# Patient Record
Sex: Male | Born: 1945 | Race: White | Hispanic: No | Marital: Married | Smoking: Current every day smoker
Health system: Southern US, Community
[De-identification: ages and names within clinical notes are randomized; demographics above are authoritative.]

## PROBLEM LIST (undated history)

## (undated) DIAGNOSIS — I639 Cerebral infarction, unspecified: Secondary | ICD-10-CM

## (undated) DIAGNOSIS — E785 Hyperlipidemia, unspecified: Secondary | ICD-10-CM

## (undated) DIAGNOSIS — M069 Rheumatoid arthritis, unspecified: Secondary | ICD-10-CM

## (undated) DIAGNOSIS — F0390 Unspecified dementia without behavioral disturbance: Secondary | ICD-10-CM

## (undated) DIAGNOSIS — I10 Essential (primary) hypertension: Secondary | ICD-10-CM

## (undated) HISTORY — DX: Cerebral infarction, unspecified: I63.9

## (undated) HISTORY — DX: Hyperlipidemia, unspecified: E78.5

## (undated) HISTORY — DX: Essential (primary) hypertension: I10

## (undated) HISTORY — DX: Unspecified dementia, unspecified severity, without behavioral disturbance, psychotic disturbance, mood disturbance, and anxiety: F03.90

## (undated) HISTORY — DX: Rheumatoid arthritis, unspecified: M06.9

---

## 2013-08-15 ENCOUNTER — Encounter: Payer: Self-pay | Admitting: Family Medicine

## 2013-08-15 ENCOUNTER — Ambulatory Visit (INDEPENDENT_AMBULATORY_CARE_PROVIDER_SITE_OTHER): Payer: Medicare Other | Admitting: Family Medicine

## 2013-08-15 ENCOUNTER — Other Ambulatory Visit: Payer: Self-pay | Admitting: Family Medicine

## 2013-08-15 VITALS — BP 132/76 | HR 86 | Temp 97.5°F | Resp 16 | Ht 70.5 in | Wt 180.0 lb

## 2013-08-15 DIAGNOSIS — R55 Syncope and collapse: Secondary | ICD-10-CM

## 2013-08-15 DIAGNOSIS — R42 Dizziness and giddiness: Secondary | ICD-10-CM

## 2013-08-15 DIAGNOSIS — R0989 Other specified symptoms and signs involving the circulatory and respiratory systems: Secondary | ICD-10-CM

## 2013-08-15 LAB — COMPREHENSIVE METABOLIC PANEL
ALBUMIN: 4.1 g/dL (ref 3.5–5.2)
ALT: 27 U/L (ref 0–53)
AST: 24 U/L (ref 0–37)
Alkaline Phosphatase: 100 U/L (ref 39–117)
BUN: 12 mg/dL (ref 6–23)
CALCIUM: 9 mg/dL (ref 8.4–10.5)
CHLORIDE: 98 meq/L (ref 96–112)
CO2: 23 mEq/L (ref 19–32)
CREATININE: 0.95 mg/dL (ref 0.50–1.35)
GLUCOSE: 111 mg/dL — AB (ref 70–99)
Potassium: 3.9 mEq/L (ref 3.5–5.3)
Sodium: 133 mEq/L — ABNORMAL LOW (ref 135–145)
Total Bilirubin: 0.5 mg/dL (ref 0.2–1.2)
Total Protein: 7.5 g/dL (ref 6.0–8.3)

## 2013-08-15 LAB — CBC WITH DIFFERENTIAL/PLATELET
BASOS PCT: 1 % (ref 0–1)
Basophils Absolute: 0.1 10*3/uL (ref 0.0–0.1)
EOS PCT: 2 % (ref 0–5)
Eosinophils Absolute: 0.1 10*3/uL (ref 0.0–0.7)
HEMATOCRIT: 48.2 % (ref 39.0–52.0)
HEMOGLOBIN: 16.4 g/dL (ref 13.0–17.0)
Lymphocytes Relative: 19 % (ref 12–46)
Lymphs Abs: 1.3 10*3/uL (ref 0.7–4.0)
MCH: 29.7 pg (ref 26.0–34.0)
MCHC: 34 g/dL (ref 30.0–36.0)
MCV: 87.2 fL (ref 78.0–100.0)
MONO ABS: 0.6 10*3/uL (ref 0.1–1.0)
MONOS PCT: 9 % (ref 3–12)
NEUTROS ABS: 4.9 10*3/uL (ref 1.7–7.7)
Neutrophils Relative %: 69 % (ref 43–77)
Platelets: 171 10*3/uL (ref 150–400)
RBC: 5.53 MIL/uL (ref 4.22–5.81)
RDW: 15.7 % — ABNORMAL HIGH (ref 11.5–15.5)
WBC: 7.1 10*3/uL (ref 4.0–10.5)

## 2013-08-15 LAB — LIPID PANEL
Cholesterol: 131 mg/dL (ref 0–200)
HDL: 28 mg/dL — AB (ref >39)
LDL Cholesterol: 68 mg/dL (ref 0–99)
TRIGLYCERIDES: 176 mg/dL — AB (ref <150)
Total CHOL/HDL Ratio: 4.7 Ratio
VLDL: 35 mg/dL (ref 0–40)

## 2013-08-15 LAB — TSH: TSH: 5.692 u[IU]/mL — ABNORMAL HIGH (ref 0.350–4.500)

## 2013-08-15 NOTE — Progress Notes (Signed)
Patient ID: Terry Francis, male   DOB: June 14, 1945, 68 y.o.   MRN: [id number]   Subjective:    Patient ID: Terry Francis, male    DOB: 1945-03-05, 68 y.o.   MRN: [id number]  Patient presents for Passing out  patient here secondary to syncopal event on this past Monday. He states he's been having increasing get dizzy episodes on and off for the past month or so he's also been very fatigued. He was going to a gas station bathroom when he just collapsed he's not sure how long he was in there but he was pumping gas it was still going by the time he went back outside so he does not think it was very long. He was assisted by anyone. He does not have any particular injuries and did not go to the hospital at that time. He's not had any chest pain or shortness of breath. He does have a history of myocardial infarction about 15 years ago. He's not take any medications with the exception of methotrexate for rheumatoid arthritis which is given by Dr. Dareen Piano that he does not take this on a regular basis. He states that the dizzy spells which has come on him know where it he's never had anything like this in the past    Review Of Systems:  GEN- + fatigue, fever, weight loss,weakness, recent illness HEENT- denies eye drainage, change in vision, nasal discharge, CVS- denies chest pain, palpitations RESP- denies SOB, cough, wheeze ABD- denies N/V, change in stools, abd pain GU- denies dysuria, hematuria, dribbling, incontinence MSK- denies joint pain, muscle aches, injury Neuro- +headache, +dizziness, syncope, seizure activity       Objective:    BP 132/76  Pulse 86  Temp(Src) 97.5 F (36.4 C) (Oral)  Resp 16  Ht 5' 10.5" (1.791 m)  Wt 180 lb (81.647 kg)  BMI 25.45 kg/m2 GEN- NAD, alert and oriented x3 HEENT- PERRL, EOMI, non injected sclera, pink conjunctiva, MMM, oropharynx clear Neck- Supple, no thyromegaly CVS- RRR, no murmur RESP-CTAB ABD-NABS,soft,NT,ND EXT- No edema Pulses- Radial,  DP- 2+ Neuro- CNII- XII in tact  EKG- NSR, LAD  Orthostatics- Lying 140/80  siting 132/76, standing 132/78       Assessment & Plan:      Problem List Items Addressed This Visit   Syncope and collapse - Primary     MRI of brain, carotid dopplers based on age  Check metabolic panel, TSH, look for other risk factors No Hypotension, EKG normal Possible dehydration during episode but does not explain the other dizzy spells, ( ? BPV)    Relevant Orders      CBC with Differential (Completed)      Comprehensive metabolic panel (Completed)      Lipid panel (Completed)      TSH (Completed)      PSA (Completed)      CT Head Wo Contrast      US Carotid Duplex Bilateral   Dizziness and giddiness     Per above work up, rule out neurological events and carotid stenosis first,    Relevant Orders      CT Head Wo Contrast      US Carotid Duplex Bilateral   Carotid bruit      Note: This dictation was prepared with Dragon dictation along with smaller phrase technology. Any transcriptional errors that result from this process are unintentional.

## 2013-08-15 NOTE — Patient Instructions (Addendum)
We will call with appt for CT of head  Ultrasound of neck We will call with lab results F/U pending results

## 2013-08-16 LAB — PSA: PSA: 0.86 ng/mL (ref <=4.00)

## 2013-08-17 ENCOUNTER — Telehealth: Payer: Self-pay | Admitting: *Deleted

## 2013-08-17 ENCOUNTER — Other Ambulatory Visit: Payer: Self-pay | Admitting: Family Medicine

## 2013-08-17 DIAGNOSIS — R42 Dizziness and giddiness: Secondary | ICD-10-CM

## 2013-08-17 DIAGNOSIS — R55 Syncope and collapse: Secondary | ICD-10-CM

## 2013-08-17 LAB — T3, FREE: T3, Free: 2.8 pg/mL (ref 2.3–4.2)

## 2013-08-17 LAB — T4, FREE: Free T4: 1.02 ng/dL (ref 0.80–1.80)

## 2013-08-17 NOTE — Telephone Encounter (Signed)
Received a call from Alvino Chapel at [facility] stating that pt authorization number showed MRI and on order was CT and needed to be changed if needed to be MRI, I told her that her insurance stated that it was CT authorization we both looked up the number and was MRI, I called insurance company to see if can change it to CT and they we can but his with his symptoms he was having he was approved for MRI instead of CT. I went to ask ordering provider if ok to order MRI since was approved and she stated it is ok. I ordered MRI and received authorization on it and called [facility] and pt is now scheduled for Sunday at 930am, pt is aware of change in appointment.

## 2013-08-18 DIAGNOSIS — R0989 Other specified symptoms and signs involving the circulatory and respiratory systems: Secondary | ICD-10-CM | POA: Insufficient documentation

## 2013-08-18 DIAGNOSIS — R42 Dizziness and giddiness: Secondary | ICD-10-CM | POA: Insufficient documentation

## 2013-08-18 NOTE — Assessment & Plan Note (Signed)
MRI of brain, carotid dopplers based on age  Check metabolic panel, TSH, look for other risk factors No Hypotension, EKG normal Possible dehydration during episode but does not explain the other dizzy spells, ( ? BPV)

## 2013-08-18 NOTE — Assessment & Plan Note (Signed)
Per above work up, rule out neurological events and carotid stenosis first,

## 2013-08-19 ENCOUNTER — Ambulatory Visit
Admission: RE | Admit: 2013-08-19 | Discharge: 2013-08-19 | Disposition: A | Discharge status: Home / Self Care | Payer: Self-pay | Source: Ambulatory Visit | Attending: Family Medicine | Admitting: Family Medicine

## 2013-08-19 DIAGNOSIS — R42 Dizziness and giddiness: Secondary | ICD-10-CM

## 2013-08-19 DIAGNOSIS — R55 Syncope and collapse: Secondary | ICD-10-CM

## 2013-08-20 ENCOUNTER — Other Ambulatory Visit: Payer: Self-pay

## 2013-08-20 ENCOUNTER — Ambulatory Visit
Admission: RE | Admit: 2013-08-20 | Discharge: 2013-08-20 | Disposition: A | Discharge status: Home / Self Care | Payer: Medicare Other | Source: Ambulatory Visit | Attending: Family Medicine | Admitting: Family Medicine

## 2013-08-20 DIAGNOSIS — R42 Dizziness and giddiness: Secondary | ICD-10-CM

## 2013-08-20 DIAGNOSIS — R55 Syncope and collapse: Secondary | ICD-10-CM

## 2013-08-22 ENCOUNTER — Ambulatory Visit (INDEPENDENT_AMBULATORY_CARE_PROVIDER_SITE_OTHER): Payer: Medicare Other | Admitting: Family Medicine

## 2013-08-22 ENCOUNTER — Encounter: Payer: Self-pay | Admitting: Family Medicine

## 2013-08-22 VITALS — BP 122/68 | HR 72 | Resp 16 | Ht 70.5 in | Wt 180.0 lb

## 2013-08-22 DIAGNOSIS — R42 Dizziness and giddiness: Secondary | ICD-10-CM

## 2013-08-22 DIAGNOSIS — R5381 Other malaise: Secondary | ICD-10-CM

## 2013-08-22 DIAGNOSIS — R5383 Other fatigue: Secondary | ICD-10-CM

## 2013-08-22 DIAGNOSIS — R55 Syncope and collapse: Secondary | ICD-10-CM

## 2013-08-22 DIAGNOSIS — R7309 Other abnormal glucose: Secondary | ICD-10-CM

## 2013-08-22 DIAGNOSIS — R7989 Other specified abnormal findings of blood chemistry: Secondary | ICD-10-CM

## 2013-08-22 NOTE — Assessment & Plan Note (Signed)
Repeat TSH in about 6 week

## 2013-08-22 NOTE — Assessment & Plan Note (Signed)
He continues to have dizzy spells however his workup has not revealed anything significant at this time that would be the cause. His blood pressure looks okay. I will send him to cardiology with his cardiac history to have him put a monitor on him to see we are missing any arrhythmias

## 2013-08-22 NOTE — Patient Instructions (Signed)
Referral to cardiology Testosterone level to be done, and repeat Sugar level F/U pending results

## 2013-08-22 NOTE — Assessment & Plan Note (Signed)
Per above regarding cardiology this may just be multifactorial with his age. I doubt the subclinical hypothyroidism is the cause therefore would not start any medication at this time. I will also check his testosterone level, and get an A1c as his glucose that he was not fasting was elevated some

## 2013-08-22 NOTE — Progress Notes (Signed)
Patient ID: Terry Francis, male   DOB: October 24, 1945, 68 y.o.   MRN: [id number]   Subjective:    Patient ID: Terry Francis, male    DOB: 08/12/1945, 68 y.o.   MRN: [id number]  Patient presents for Discuss Lab Results  patient here to followup recent dilation for syncope fatigue and dizziness. He's had another episode of dizziness that this came on out of nowhere. He continues to complain of been severely fatigued and having no energy. He denies any chest pain or palpitations but his wife is here today he states that he when he had his heart attack he did not having severe chest pain. His MRI showed chronic microvascular ischemic areas but nothing acute thought this was consistent with old strokes. His carotid Dopplers showed minimal stenosis less than 50% bilaterally his blood work showed minimally elevated glucose however he was nonfasting he also had a mildly elevated TSH but his T3 and T4 were normal making this consistent with a possible subclinical hypothyroidism. He states that he does take B12 and does not notice any difference with the I did discuss with his wife she has not noticed any change in his speech or his behavior   Review Of Systems:  GEN- denies fatigue, fever, weight loss,weakness, recent illness HEENT- denies eye drainage, change in vision, nasal discharge, CVS- denies chest pain, palpitations RESP- denies SOB, cough, wheeze MSK- denies joint pain, muscle aches, injury Neuro- denies headache, +dizziness, syncope, seizure activity       Objective:    BP 122/68  Pulse 72  Resp 16  Ht 5' 10.5" (1.791 m)  Wt 180 lb (81.647 kg)  BMI 25.45 kg/m2 GEN- NAD, alert and oriented x3 CVS- RRR, no murmur RESP-CTAB EXT- No edema Pulses- Radial 2+        Assessment & Plan:      Problem List Items Addressed This Visit   None    Visit Diagnoses   Other malaise and fatigue    -  Primary    Relevant Orders       Testosterone    Elevated glucose        Relevant Orders        Hemoglobin A1c       Note: This dictation was prepared with Dragon dictation along with smaller phrase technology. Any transcriptional errors that result from this process are unintentional.

## 2013-08-23 LAB — HEMOGLOBIN A1C
HEMOGLOBIN A1C: 6.2 % — AB (ref <5.7)
Mean Plasma Glucose: 131 mg/dL — ABNORMAL HIGH (ref <117)

## 2013-08-23 LAB — TESTOSTERONE: Testosterone: 252 ng/dL — ABNORMAL LOW (ref 300–890)

## 2013-09-03 ENCOUNTER — Encounter: Payer: Self-pay | Admitting: Family Medicine

## 2013-09-05 ENCOUNTER — Ambulatory Visit: Payer: Self-pay | Admitting: Family Medicine

## 2013-09-19 ENCOUNTER — Telehealth: Payer: Self-pay | Admitting: Family Medicine

## 2013-09-19 NOTE — Telephone Encounter (Signed)
Called and LM with wife for pt to call back and schedule [facility] and CPE

## 2013-09-25 ENCOUNTER — Encounter: Payer: Self-pay | Admitting: Family Medicine

## 2013-10-03 ENCOUNTER — Other Ambulatory Visit: Payer: Medicare Other

## 2013-10-10 ENCOUNTER — Ambulatory Visit (INDEPENDENT_AMBULATORY_CARE_PROVIDER_SITE_OTHER): Payer: Medicare Other | Admitting: Family Medicine

## 2013-10-10 ENCOUNTER — Encounter: Payer: Self-pay | Admitting: Family Medicine

## 2013-10-10 VITALS — BP 136/62 | HR 72 | Temp 97.5°F | Resp 14 | Ht 70.0 in | Wt 182.0 lb

## 2013-10-10 DIAGNOSIS — R42 Dizziness and giddiness: Secondary | ICD-10-CM

## 2013-10-10 DIAGNOSIS — E039 Hypothyroidism, unspecified: Secondary | ICD-10-CM

## 2013-10-10 DIAGNOSIS — Z23 Encounter for immunization: Secondary | ICD-10-CM

## 2013-10-10 DIAGNOSIS — Z0000 Encounter for general adult medical examination without abnormal findings: Secondary | ICD-10-CM

## 2013-10-10 DIAGNOSIS — E038 Other specified hypothyroidism: Secondary | ICD-10-CM

## 2013-10-10 DIAGNOSIS — E349 Endocrine disorder, unspecified: Secondary | ICD-10-CM

## 2013-10-10 DIAGNOSIS — E291 Testicular hypofunction: Secondary | ICD-10-CM

## 2013-10-10 LAB — CBC WITH DIFFERENTIAL/PLATELET
BASOS ABS: 0.1 10*3/uL (ref 0.0–0.1)
BASOS PCT: 1 % (ref 0–1)
Eosinophils Absolute: 0.1 10*3/uL (ref 0.0–0.7)
Eosinophils Relative: 2 % (ref 0–5)
HCT: 47.4 % (ref 39.0–52.0)
HEMOGLOBIN: 16.3 g/dL (ref 13.0–17.0)
LYMPHS PCT: 18 % (ref 12–46)
Lymphs Abs: 1.1 10*3/uL (ref 0.7–4.0)
MCH: 29.7 pg (ref 26.0–34.0)
MCHC: 34.4 g/dL (ref 30.0–36.0)
MCV: 86.5 fL (ref 78.0–100.0)
MONOS PCT: 9 % (ref 3–12)
Monocytes Absolute: 0.6 10*3/uL (ref 0.1–1.0)
NEUTROS ABS: 4.3 10*3/uL (ref 1.7–7.7)
NEUTROS PCT: 70 % (ref 43–77)
Platelets: 150 10*3/uL (ref 150–400)
RBC: 5.48 MIL/uL (ref 4.22–5.81)
RDW: 16.2 % — ABNORMAL HIGH (ref 11.5–15.5)
WBC: 6.2 10*3/uL (ref 4.0–10.5)

## 2013-10-10 LAB — COMPREHENSIVE METABOLIC PANEL
ALBUMIN: 4 g/dL (ref 3.5–5.2)
ALT: 26 U/L (ref 0–53)
AST: 23 U/L (ref 0–37)
Alkaline Phosphatase: 103 U/L (ref 39–117)
BUN: 11 mg/dL (ref 6–23)
CALCIUM: 9.1 mg/dL (ref 8.4–10.5)
CO2: 22 mEq/L (ref 19–32)
Chloride: 99 mEq/L (ref 96–112)
Creat: 0.89 mg/dL (ref 0.50–1.35)
GLUCOSE: 155 mg/dL — AB (ref 70–99)
POTASSIUM: 4.2 meq/L (ref 3.5–5.3)
Sodium: 132 mEq/L — ABNORMAL LOW (ref 135–145)
Total Bilirubin: 0.5 mg/dL (ref 0.2–1.2)
Total Protein: 7.1 g/dL (ref 6.0–8.3)

## 2013-10-10 LAB — VITAMIN B12: VITAMIN B 12: 875 pg/mL (ref 211–911)

## 2013-10-10 MED ORDER — LEVOTHYROXINE SODIUM 25 MCG PO TABS: 25.0000 ug | ORAL_TABLET | Freq: Every day | ORAL | Status: DC

## 2013-10-10 MED ORDER — MECLIZINE HCL 25 MG PO TABS: 25.0000 mg | ORAL_TABLET | Freq: Three times a day (TID) | ORAL | Status: DC | PRN

## 2013-10-10 NOTE — Patient Instructions (Addendum)
Pneumonia vaccine given  We will call with lab work Start the thyroid - take first thing in the morning  F/U 3 months

## 2013-10-11 DIAGNOSIS — E038 Other specified hypothyroidism: Secondary | ICD-10-CM | POA: Insufficient documentation

## 2013-10-11 DIAGNOSIS — E349 Endocrine disorder, unspecified: Secondary | ICD-10-CM | POA: Insufficient documentation

## 2013-10-11 DIAGNOSIS — E039 Hypothyroidism, unspecified: Secondary | ICD-10-CM | POA: Insufficient documentation

## 2013-10-11 LAB — FSH/LH
FSH: 3.6 m[IU]/mL (ref 1.4–18.1)
LH: 7 m[IU]/mL (ref 1.5–9.3)

## 2013-10-11 NOTE — Progress Notes (Signed)
Patient ID: Terry Francis, male   DOB: 11-Sep-1945, 68 y.o.   MRN: [id number] Subjective:   Patient presents for Medicare Annual/Subsequent preventive examination.   Review Past Medical/Family/Social: per EMR   Pt here for CPE, he was upset he was here and wanted to know why since he just had a visit. He declines immunizations, colonoscopy. Declines hearing assesment or hearing aide. He was recently evaluated for dizziness and syncope, recommended cardiac work up he declines, continues to have dizzy spells and low energy, labs did show subclinical hypothyrodism  Risk Factors  Current exercise habits: walks Dietary issues discussed: yes  Cardiac risk factors: none  Depression Screen  (Note: if answer to either of the following is "Yes", a more complete depression screening is indicated)  Over the past two weeks, have you felt down, depressed or hopeless? No Over the past two weeks, have you felt little interest or pleasure in doing things? No Have you lost interest or pleasure in daily life? No Do you often feel hopeless? No Do you cry easily over simple problems? No   Activities of Daily Living  In your present state of health, do you have any difficulty performing the following activities?:  Driving? No  Managing money? No  Feeding yourself? No  Getting from bed to chair? No  Climbing a flight of stairs? No  Preparing food and eating?: No  Bathing or showering? No  Getting dressed: No  Getting to the toilet? No  Using the toilet:No  Moving around from place to place: No  In the past year have you fallen or had a near fall?:yes  Are you sexually active? No  Do you have more than one partner? No   Hearing Difficulties: No  Do you often ask people to speak up or repeat themselves? yes Do you experience ringing or noises in your ears? No Do you have difficulty understanding soft or whispered voices? yes Do you feel that you have a problem with memory? yes Do you often  misplace items? No  Do you feel safe at home? Yes  Cognitive Testing  Alert? Yes Normal Appearance?Yes  Oriented to person? Yes Place? Yes  Time? Yes  Recall of three objects? Yes  Can perform simple calculations? Yes  Displays appropriate judgment?Yes  Can read the correct time from a watch face?Yes   List the Names of Other Physician/Practitioners you currently use: [facility]     Screening Tests / Date Colonoscopy  - states he had in past                  Zostavax - declines Influenza Vaccine  Tetanus/tdap- unkown  GEN- + fatigue, fever, weight loss,weakness, recent illness HEENT- denies eye drainage, change in vision, nasal discharge, CVS- denies chest pain, palpitations RESP- denies SOB, cough, wheeze ABD- denies N/V, change in stools, abd pain GU- denies dysuria, hematuria, dribbling, incontinence MSK- denies joint pain, muscle aches, injury Neuro- denies headache, dizziness, syncope, seizure activity  GEN- NAD, alert and oriented x3 HEENT- PERRL, EOMI, non injected sclera, pink conjunctiva, MMM, oropharynx clear Neck- Supple, no LAD CVS- RRR, no murmur RESP-CTAB ABD-NABS,soft,NT,ND EXT- No edema Pulses- Radial, DP- 2+   Assessment:    Annual wellness medicare exam   Plan:    During the course of the visit the patient was educated and counseled about appropriate screening and preventive services including:   Colorectal cancer screening - declines  Declines shots Declines hearing assessment   Prevnar 13 given  Diet  review for nutrition referral? Yes ____ Not Indicated __x__  Patient Instructions (the written plan) was given to the patient.  Medicare Attestation  I have personally reviewed:  The patient's medical and social history  Their use of alcohol, tobacco or illicit drugs  Their current medications and supplements  The patient's functional ability including ADLs,fall risks, home safety risks, cognitive, and hearing and visual impairment   Diet and physical activities  Evidence for depression or mood disorders  The patient's weight, height, BMI, and visual acuity have been recorded in the chart. I have made referrals, counseling, and provided education to the patient based on review of the above and I have provided the patient with a written personalized care plan for preventive services.

## 2013-10-11 NOTE — Assessment & Plan Note (Signed)
Low testosterone but I do not hink this is cause of his previous symptoms , syncope, will not treat at this time, start with thyroid replacement

## 2013-10-11 NOTE — Assessment & Plan Note (Signed)
Possible cause of his symptoms, treat with synthroid once a day

## 2013-12-01 ENCOUNTER — Other Ambulatory Visit: Payer: Self-pay | Admitting: Family Medicine

## 2013-12-04 NOTE — Telephone Encounter (Signed)
OK refill?? 

## 2014-06-11 ENCOUNTER — Encounter: Payer: Self-pay | Admitting: Family Medicine

## 2014-06-11 ENCOUNTER — Ambulatory Visit (INDEPENDENT_AMBULATORY_CARE_PROVIDER_SITE_OTHER): Payer: Medicare HMO | Admitting: Family Medicine

## 2014-06-11 VITALS — BP 108/60 | HR 80 | Temp 97.8°F | Resp 18 | Wt 180.0 lb

## 2014-06-11 DIAGNOSIS — R42 Dizziness and giddiness: Secondary | ICD-10-CM | POA: Diagnosis not present

## 2014-06-11 DIAGNOSIS — F0391 Unspecified dementia with behavioral disturbance: Secondary | ICD-10-CM

## 2014-06-11 DIAGNOSIS — I679 Cerebrovascular disease, unspecified: Secondary | ICD-10-CM

## 2014-06-11 DIAGNOSIS — F03918 Unspecified dementia, unspecified severity, with other behavioral disturbance: Secondary | ICD-10-CM

## 2014-06-11 MED ORDER — DONEPEZIL HCL 10 MG PO TABS: 10.0000 mg | ORAL_TABLET | Freq: Every day | ORAL | Status: AC

## 2014-06-11 NOTE — Progress Notes (Signed)
Subjective:    Patient ID: Terry Francis, male    DOB: 04-18-1945, 69 y.o.   MRN: [id number]  HPI  Patient is here today accompanied by his wife.  He is complaining of dizziness. Every morning when he wakes up he staggers getting out of the bed due to a sense of imbalance.  He also has some mild vertigo. He was treated with meclizine without relief last year. 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  Patient is not taking aspirin. He continues to smoke. He is on no statin medication. However his wife is more concerned by his confusion. She states that every few days he will become very confused and disoriented. For example he will walk around the house neck and for no reason. When she tells him to go put on his housecoat, the patient will return wearing a dress shirt. He is unsure of the date and the time in the day. She frequently has to remind him of things. So has to repeat what she told him because he forgets what she told him. At times he is delirious. The symptoms gradually improved over a few days and return to the patient's baseline. Today the patient is unable to tell me what month it is, what year it is. He does know the day.  He is able to tell me the location. He is able to remember 3 objects on recall only after 4 repetitions.  Patient is illiterate and therefore cannot perform WORLD test.  However on serial sevens he is unable to calculate 1 number. This is a patient who worked years in [employer] and was constantly performing math addition and subtraction in his head. The remainder of his Mini-Mental status exam was normal except when asked the patient to draw a clock, he was able to space the numbers correctly however he was unable to draw the hands on the clock to make it say  4:30. However he is able to read the clock today and tell me what time it is.  Past Medical History  Diagnosis Date  .  RA (rheumatoid arthritis)    No past surgical history on file. Current Outpatient Prescriptions on File Prior to Visit  Medication Sig Dispense Refill  . HYDROcodone-acetaminophen (NORCO) 7.5-325 MG per tablet Take 1 tablet by mouth every 6 (six) hours as needed for moderate pain.    Marland Kitchen levothyroxine (SYNTHROID) 25 MCG tablet Take 1 tablet (25 mcg total) by mouth daily before breakfast. 30 tablet 3  . meclizine (ANTIVERT) 25 MG tablet TAKE 1 TABLET BY MOUTH 3 TIMES DAILY AS NEEDED FOR DIZZINESS. (Patient not taking: Reported on 06/11/2014) 30 tablet 1  . methotrexate (RHEUMATREX) 7.5 MG tablet Take 7.5 mg by mouth once a week. Caution" Chemotherapy. Protect from light.     No current facility-administered medications on file prior to visit.   No Known Allergies History   Social History  . Marital Status: Married    Spouse Name: N/A  . Number of Children: N/A  . Years of Education: N/A   Occupational History  . Not on file.   Social History Main Topics  . Smoking status: Current Every Day Smoker -- 1.00 packs/day for 50 years    Types: Cigarettes  . Smokeless tobacco: Never Used  . Alcohol Use: Yes  . Drug Use: No  . Sexual Activity: Yes   Other Topics Concern  . Not on  file   Social History Narrative     Review of Systems  All other systems reviewed and are negative.      Objective:   Physical Exam  Constitutional: He is oriented to person, place, and time.  Cardiovascular: Normal rate, regular rhythm and normal heart sounds.   No murmur heard. Pulmonary/Chest: Effort normal. He has wheezes.  Abdominal: Soft. Bowel sounds are normal. He exhibits no distension. There is no tenderness. There is no rebound and no guarding.  Neurological: He is alert and oriented to person, place, and time. He has normal reflexes. He displays normal reflexes. No cranial nerve deficit. He exhibits normal muscle tone. Coordination abnormal.  Psychiatric: He has a normal mood and affect.  His behavior is normal. Judgment and thought content normal.  Vitals reviewed.         Assessment & Plan:  Dementia with behavioral disturbance - Plan: donepezil (ARICEPT) 10 MG tablet, Ambulatory referral to Neurology  Dizziness and giddiness - Plan: Ambulatory referral to Neurology  Cerebrovascular small vessel disease - Plan: Ambulatory referral to Neurology  Patient scores 23 out of 30 on his Mini-Mental status exam. I believe he has dementia with episodes of delirium. I believe it is likely vascular dementia. Therefore I recommended the patient begin Aricept 5 mg a day and in one week if he is tolerating that dose increased to 10 mg a day. As the patient tolerates I would quickly add Namenda to his medicine regimen. I believe his imbalance is likely due to a remote cerebellar strokes. Therefore I believe we need to focus our attention on secondary stroke prevention. I recommended the patient take aspirin 325 mg by mouth daily. I recommended smoking cessation. I would like him to return fasting in the morning so I can check a fasting lipid panel with a goal LDL cholesterol less than 70. I will start the patient on his statin medication if his cholesterol is greater than 70. I will also consult neurology for a second opinion.  On previous MRI there was no evidence of normal pressure hydrocephalus

## 2014-06-12 ENCOUNTER — Other Ambulatory Visit: Payer: Medicare HMO

## 2014-06-12 DIAGNOSIS — I679 Cerebrovascular disease, unspecified: Secondary | ICD-10-CM

## 2014-06-12 LAB — LIPID PANEL
Cholesterol: 119 mg/dL (ref 0–200)
HDL: 26 mg/dL — AB (ref >=40)
LDL Cholesterol: 68 mg/dL (ref 0–99)
TRIGLYCERIDES: 123 mg/dL (ref <150)
Total CHOL/HDL Ratio: 4.6 Ratio
VLDL: 25 mg/dL (ref 0–40)

## 2014-06-26 ENCOUNTER — Other Ambulatory Visit: Payer: Self-pay | Admitting: Family Medicine

## 2014-06-26 MED ORDER — ATORVASTATIN CALCIUM 20 MG PO TABS: 20.0000 mg | ORAL_TABLET | Freq: Every day | ORAL | Status: AC

## 2014-09-04 ENCOUNTER — Ambulatory Visit: Payer: Self-pay | Admitting: Neurology

## 2014-11-12 ENCOUNTER — Encounter: Payer: Self-pay | Admitting: Family Medicine

## 2014-11-12 ENCOUNTER — Ambulatory Visit (INDEPENDENT_AMBULATORY_CARE_PROVIDER_SITE_OTHER): Payer: Medicare HMO | Admitting: Family Medicine

## 2014-11-12 VITALS — BP 110/68 | HR 86 | Temp 97.9°F | Resp 16 | Ht 70.0 in | Wt 178.0 lb

## 2014-11-12 DIAGNOSIS — R296 Repeated falls: Secondary | ICD-10-CM

## 2014-11-12 DIAGNOSIS — I679 Cerebrovascular disease, unspecified: Secondary | ICD-10-CM | POA: Diagnosis not present

## 2014-11-12 DIAGNOSIS — Z8673 Personal history of transient ischemic attack (TIA), and cerebral infarction without residual deficits: Secondary | ICD-10-CM

## 2014-11-12 DIAGNOSIS — R413 Other amnesia: Secondary | ICD-10-CM | POA: Diagnosis not present

## 2014-11-12 DIAGNOSIS — M069 Rheumatoid arthritis, unspecified: Secondary | ICD-10-CM

## 2014-11-12 MED ORDER — CLOPIDOGREL BISULFATE 75 MG PO TABS: 75.0000 mg | ORAL_TABLET | Freq: Every day | ORAL | Status: AC

## 2014-11-12 NOTE — Progress Notes (Signed)
Subjective:    Patient ID: Terry Francis, male    DOB: 04-Jul-1945, 69 y.o.   MRN: [id number]  HPI  06/11/14 Patient is here today accompanied by his wife.  He is complaining of dizziness. Every morning when he wakes up he staggers getting out of the bed due to a sense of imbalance.  He also has some mild vertigo. He was treated with meclizine without relief last year. 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  Patient is not taking aspirin. He continues to smoke. He is on no statin medication. However his wife is more concerned by his confusion. She states that every few days he will become very confused and disoriented. For example he will walk around the house naked for no reason. When she tells him to go put on his housecoat, the patient will return wearing a dress shirt. He is unsure of the date and the time of day. She frequently has to remind him of things. Has to repeat what she told him because he forgets what she told him. At times he is delirious. The symptoms will gradually improve over a few days and return to the patient's baseline. Today the patient is unable to tell me what month it is, what year it is. He does know the day.  He is able to tell me the location. He is able to remember 3 objects on recall only after 4 repetitions.  Patient is illiterate and therefore cannot perform WORLD test.  However on serial sevens he is unable to calculate 1 number. This is a patient who worked years in [employer] and was constantly performing math addition and subtraction in his head. The remainder of his Mini-Mental status exam was normal except when asked the patient to draw a clock, he was able to space the numbers correctly however he was unable to draw the hands on the clock to make it say  4:30. However he is able to read the clock today and tell me what time it is.    At that time, my plan was: Patient  scores 23 out of 30 on his Mini-Mental status exam. I believe he has dementia with episodes of delirium. I believe it is likely vascular dementia. Therefore I recommended the patient begin Aricept 5 mg a day and in one week if he is tolerating that dose increased to 10 mg a day. As the patient tolerates I would quickly add Namenda to his medicine regimen. I believe his imbalance is likely due to a remote cerebellar strokes. Therefore I believe we need to focus our attention on secondary stroke prevention. I recommended the patient take aspirin 325 mg by mouth daily. I recommended smoking cessation. I would like him to return fasting in the morning so I can check a fasting lipid panel with a goal LDL cholesterol less than 70. I will start the patient on his statin medication if his cholesterol is greater than 70. I will also consult neurology for a second opinion.  On previous MRI there was no evidence of normal pressure hydrocephalus  11/12/14 He is here today for follow-up. He is a [place] his daughter and his wife. He continues to smoke heavily with no desire to quit.  He also stopped taking his aspirin. Recently his balance has worsened. He is falling more frequently. During our visit today, I had patient stand up from a chair and he  almost fell into the cabinet. I walked him around our office. He has a short stride with shuffling gait. There is no pill-rolling tremor but he does have a masklike facies and slowed reaction time.. Of note his MRI showed chronic microvascular damage in the basal ganglia which may be contributing some to Parkinson-like features. He is taking the Aricept for memory as well as the Lipitor for cholesterol. His biggest concern is his continued worsening balance and his frequent falls. He would also like a referral to a different rheumatologist to help manage his rheumatoid arthritis. His previous rheumatologist left the area.  Of note I had scheduled him to see a neurologist in July  and he missed that appointment but this is mainly due to his memory problems  Past Medical History  Diagnosis Date  . RA (rheumatoid arthritis)    No past surgical history on file. Current Outpatient Prescriptions on File Prior to Visit  Medication Sig Dispense Refill  . atorvastatin (LIPITOR) 20 MG tablet Take 1 tablet (20 mg total) by mouth daily. 30 tablet 3  . donepezil (ARICEPT) 10 MG tablet Take 1 tablet (10 mg total) by mouth at bedtime. 30 tablet 5  . HYDROcodone-acetaminophen (NORCO) 7.5-325 MG per tablet Take 1 tablet by mouth every 6 (six) hours as needed for moderate pain.    Marland Kitchen levothyroxine (SYNTHROID) 25 MCG tablet Take 1 tablet (25 mcg total) by mouth daily before breakfast. 30 tablet 3  . meclizine (ANTIVERT) 25 MG tablet TAKE 1 TABLET BY MOUTH 3 TIMES DAILY AS NEEDED FOR DIZZINESS. (Patient not taking: Reported on 06/11/2014) 30 tablet 1  . methotrexate (RHEUMATREX) 7.5 MG tablet Take 7.5 mg by mouth once a week. Caution" Chemotherapy. Protect from light.     No current facility-administered medications on file prior to visit.   No Known Allergies Social History   Social History  . Marital Status: Married    Spouse Name: N/A  . Number of Children: N/A  . Years of Education: N/A   Occupational History  . Not on file.   Social History Main Topics  . Smoking status: Current Every Day Smoker -- 1.00 packs/day for 50 years    Types: Cigarettes  . Smokeless tobacco: Never Used  . Alcohol Use: Yes  . Drug Use: No  . Sexual Activity: Yes   Other Topics Concern  . Not on file   Social History Narrative     Review of Systems  All other systems reviewed and are negative.      Objective:   Physical Exam  Constitutional: He is oriented to person, place, and time.  Cardiovascular: Normal rate, regular rhythm and normal heart sounds.   No murmur heard. Pulmonary/Chest: Effort normal. He has wheezes.  Abdominal: Soft. Bowel sounds are normal. He exhibits no  distension. There is no tenderness. There is no rebound and no guarding.  Neurological: He is alert and oriented to person, place, and time. He has normal reflexes. No cranial nerve deficit. He exhibits normal muscle tone. Coordination abnormal.  Psychiatric: He has a normal mood and affect. His behavior is normal. Judgment and thought content normal.  Vitals reviewed.         Assessment & Plan:  History of stroke - Plan: clopidogrel (PLAVIX) 75 MG tablet  Memory loss - Plan: COMPLETE METABOLIC PANEL WITH GFR, TSH  Cerebrovascular small vessel disease  Falls frequently  Rheumatoid arthritis - Plan: Ambulatory referral to Rheumatology  I suspect the patient has had another small  stroke. Therefore I will discontinue aspirin and I will start the patient on Plavix 75 mg by mouth daily. I believe he would benefit from another MRI but I'm going to go ahead and schedule the patient to see a neurologist and I would expect that they will perform one there and her office and therefore I will defer that to them. I will check a CMP to make sure that his hyponatremia has not worsened and I will also check a TSH to make sure his hypothyroidism has not worsened. I strongly encouraged him to quit smoking. I explained that his microvascular brain damage is causing his memory problems as well as his problems with balance as he has had strokes in his basal ganglia as well as in his cerebellum. I believe he would benefit from physical therapy for the neurologist office.

## 2014-11-13 LAB — COMPLETE METABOLIC PANEL WITH GFR
ALBUMIN: 4 g/dL (ref 3.6–5.1)
ALT: 31 U/L (ref 9–46)
AST: 27 U/L (ref 10–35)
Alkaline Phosphatase: 92 U/L (ref 40–115)
BILIRUBIN TOTAL: 0.8 mg/dL (ref 0.2–1.2)
BUN: 11 mg/dL (ref 7–25)
CO2: 25 mmol/L (ref 20–31)
CREATININE: 0.94 mg/dL (ref 0.70–1.18)
Calcium: 9.1 mg/dL (ref 8.6–10.3)
Chloride: 102 mmol/L (ref 98–110)
GFR, Est Non African American: 81 mL/min (ref >=60)
Glucose, Bld: 85 mg/dL (ref 70–99)
Potassium: 4.1 mmol/L (ref 3.5–5.3)
Sodium: 142 mmol/L (ref 135–146)
TOTAL PROTEIN: 7.1 g/dL (ref 6.1–8.1)

## 2014-11-13 LAB — TSH: TSH: 3.158 u[IU]/mL (ref 0.350–4.500)

## 2014-11-15 ENCOUNTER — Telehealth: Payer: Self-pay | Admitting: Family Medicine

## 2014-11-15 NOTE — Telephone Encounter (Signed)
Patient aware of results via vm  

## 2014-11-15 NOTE — Telephone Encounter (Signed)
-----   Message from Donita Brooks, MD sent at 11/14/2014  7:00 AM EDT ----- Thyroid test and sodium levels are nml.

## 2014-12-23 ENCOUNTER — Ambulatory Visit
Admission: RE | Admit: 2014-12-23 | Discharge: 2014-12-23 | Disposition: A | Discharge status: Home / Self Care | Payer: Medicare HMO | Source: Ambulatory Visit | Attending: Rheumatology | Admitting: Rheumatology

## 2014-12-23 ENCOUNTER — Ambulatory Visit (INDEPENDENT_AMBULATORY_CARE_PROVIDER_SITE_OTHER): Payer: Medicare HMO | Admitting: Neurology

## 2014-12-23 ENCOUNTER — Telehealth: Payer: Self-pay

## 2014-12-23 ENCOUNTER — Other Ambulatory Visit: Payer: Self-pay | Admitting: Rheumatology

## 2014-12-23 ENCOUNTER — Encounter: Payer: Self-pay | Admitting: Neurology

## 2014-12-23 VITALS — BP 147/84 | HR 87 | Wt 177.5 lb

## 2014-12-23 DIAGNOSIS — R0602 Shortness of breath: Secondary | ICD-10-CM

## 2014-12-23 DIAGNOSIS — M545 Low back pain: Secondary | ICD-10-CM

## 2014-12-23 DIAGNOSIS — I679 Cerebrovascular disease, unspecified: Secondary | ICD-10-CM | POA: Diagnosis not present

## 2014-12-23 DIAGNOSIS — F0150 Vascular dementia without behavioral disturbance: Secondary | ICD-10-CM | POA: Insufficient documentation

## 2014-12-23 DIAGNOSIS — G309 Alzheimer's disease, unspecified: Secondary | ICD-10-CM | POA: Diagnosis not present

## 2014-12-23 DIAGNOSIS — Z720 Tobacco use: Secondary | ICD-10-CM | POA: Diagnosis not present

## 2014-12-23 DIAGNOSIS — R030 Elevated blood-pressure reading, without diagnosis of hypertension: Secondary | ICD-10-CM

## 2014-12-23 DIAGNOSIS — F17200 Nicotine dependence, unspecified, uncomplicated: Secondary | ICD-10-CM

## 2014-12-23 DIAGNOSIS — IMO0001 Reserved for inherently not codable concepts without codable children: Secondary | ICD-10-CM

## 2014-12-23 DIAGNOSIS — F0280 Dementia in other diseases classified elsewhere without behavioral disturbance: Secondary | ICD-10-CM

## 2014-12-23 MED ORDER — MEMANTINE HCL ER 7 & 14 & 21 &28 MG PO CP24: ORAL_CAPSULE | ORAL | Status: AC

## 2014-12-23 NOTE — Telephone Encounter (Signed)
Spoke to wife. Aware of upcoming appointments.

## 2014-12-23 NOTE — Progress Notes (Signed)
Pt scheduled for US Carotid at [facility] 10/27 @ 1 pm. Echocardiogram @ [facility] 4 p.m. On 12/27/14 and MRI @ [facility] @ :45 p.m. 12/27/14.

## 2014-12-23 NOTE — Patient Instructions (Addendum)
1.  In addition to donepezil, we will start Namenda XR.  Take starter pack as directed.  Call for refill.  Side effects include dizziness, headache, diarrhea or constipation.  Call with any questions or concerns. 2.  Check carotid doppler and 2D echocardiogram 3.  No driving 4.  Recommend getting life-alert to wear if he should fall and cannot get up when nobody is home 5.  Follow up in 6 months. 6.  Recheck Blood pressure with primary care physician.  It is a little elevated today 7.  Quit smoking

## 2014-12-23 NOTE — Telephone Encounter (Signed)
Attempted to reach wife to let her know appointments have been scheduled. Pt has Echo appointment at [phone]) [phone] 12/27/14 @ 4 p.m. US Carotid - [facility] ([address]) Thursday 12/26/14 @ 12:40, and MRI @ [facility] [phone]) 12/27/14 @ 6:45

## 2014-12-23 NOTE — Progress Notes (Signed)
NEUROLOGY CONSULTATION NOTE  BRISTOL SOY MRN: [id number] DOB: 10-Sep-1945  Referring provider: Dr. Tanya Nones Primary care provider: Dr. Tanya Nones  Reason for consult:  Memory issues  HISTORY OF PRESENT ILLNESS: Terry Francis is a 69 year old right-handed male with rheumatoid arthritis, hyperlipidemia and current smoker who presents for stroke and dementia.  History obtained by patient, and PCP notes, as well as his wife and daughter who accompany him today.  Images of brain MRI reviewed.  Labs and carotid duplex report reviewed.  He has had gradual progression of short-term memory loss over the past year.  He has always had problems with names, regarding people or streets.  However, he started repeating questions often.  He would forget recent conversations.  He would not recall recent activities.  He needs assistance in remembering to take his medication.  He still drives.  Occasionally, he may briefly have problems with orientation but denies near-accidents.  He has become more irritable.  He no longer bathes or dresses every day.  Often, we will lounge around the house in his robe.  He sometimes has bowel or bladder incontinence.  He has trouble sleeping at night and will watch TV or just smoke.  He does not wander out of the house.  His wife works at night and he is left alone.  He has longstanding history of dizziness.  He has had recurrent falls.  About a month ago, he had a fall and exhibited slurred speech.  He was started on Plavix last month.  He is retired.  He used to work in [employer].  He left school after 7th grade.  He denies family history of dementia.   MRI of the brain from 08/19/13 showed mild age-appropriate atrophy and moderate chronic small vessel ischemic changes involving the cerebral white matter, basal ganglia, pons and old infarcts in the right cerebellum.  Carotid doppler revealed less than 50% bilateral internal carotid artery  stenosis.  Labs over the past year include TSH 3.158, cholesterol 119, TG 123, HDL 26, LDL 68.  Labs from 2015 include B12 875 and Hgb A1c of 6.2.  PAST MEDICAL HISTORY: Past Medical History  Diagnosis Date  . RA (rheumatoid arthritis) (HCC)     PAST SURGICAL HISTORY: History reviewed. No pertinent past surgical history.  MEDICATIONS: Current Outpatient Prescriptions on File Prior to Visit  Medication Sig Dispense Refill  . atorvastatin (LIPITOR) 20 MG tablet Take 1 tablet (20 mg total) by mouth daily. 30 tablet 3  . clopidogrel (PLAVIX) 75 MG tablet Take 1 tablet (75 mg total) by mouth daily. 90 tablet 3  . donepezil (ARICEPT) 10 MG tablet Take 1 tablet (10 mg total) by mouth at bedtime. 30 tablet 5  . ENBREL SURECLICK 50 MG/ML injection     . HYDROcodone-acetaminophen (NORCO) 7.5-325 MG per tablet Take 1 tablet by mouth every 6 (six) hours as needed for moderate pain.    . meclizine (ANTIVERT) 25 MG tablet TAKE 1 TABLET BY MOUTH 3 TIMES DAILY AS NEEDED FOR DIZZINESS. 30 tablet 1  . methotrexate (RHEUMATREX) 7.5 MG tablet Take 7.5 mg by mouth once a week. Caution" Chemotherapy. Protect from light.     No current facility-administered medications on file prior to visit.    ALLERGIES: No Known Allergies  FAMILY HISTORY: History reviewed. No pertinent family history.  SOCIAL HISTORY: Social History   Social History  . Marital Status: Married    Spouse Name: N/A  . Number of Children: N/A  .  Years of Education: N/A   Occupational History  . Not on file.   Social History Main Topics  . Smoking status: Current Every Day Smoker -- 1.00 packs/day for 50 years    Types: Cigarettes  . Smokeless tobacco: Never Used  . Alcohol Use: Yes  . Drug Use: No  . Sexual Activity: Yes   Other Topics Concern  . Not on file   Social History Narrative   Pt lives with his wife, Terry Francis, in a one story home. He completed the 7th grade.     REVIEW OF SYSTEMS: Constitutional: No  fevers, chills, or sweats, no generalized fatigue, change in appetite Eyes: No visual changes, double vision, eye pain Ear, nose and throat: No hearing loss, ear pain, nasal congestion, sore throat Cardiovascular: No chest pain, palpitations Respiratory:  No shortness of breath at rest or with exertion, wheezes GastrointestinaI: No nausea, vomiting, diarrhea, abdominal pain, fecal incontinence Genitourinary:  No dysuria, urinary retention or frequency Musculoskeletal:  No neck pain, back pain Integumentary: No rash, pruritus, skin lesions Neurological: as above Psychiatric: No depression, insomnia, anxiety Endocrine: No palpitations, fatigue, diaphoresis, mood swings, change in appetite, change in weight, increased thirst Hematologic/Lymphatic:  No anemia, purpura, petechiae. Allergic/Immunologic: no itchy/runny eyes, nasal congestion, recent allergic reactions, rashes  PHYSICAL EXAM: Filed Vitals:   12/23/14 0821  BP: 147/84  Pulse: 87   General: No acute distress.   Head:  Normocephalic/atraumatic Eyes:  fundi unremarkable, without vessel changes, exudates, hemorrhages or papilledema. Neck: supple, no paraspinal tenderness, full range of motion Back: No paraspinal tenderness Heart: regular rate and rhythm Lungs: Clear to auscultation bilaterally. Vascular: No carotid bruits. Neurological Exam: Mental status: alert and oriented to person and place, but not time, recent memory poor, remote memory intact; fund of knowledge, attention and concentration impaired; speech fluent and not dysarthric,difficulty with naming and following complex commands. Montreal Cognitive Assessment  12/23/2014  Visuospatial/ Executive (0/5) 0  Naming (0/3) 0  Attention: Read list of digits (0/2) 0  Attention: Read list of letters (0/1) 1  Attention: Serial 7 subtraction starting at 100 (0/3) 1  Language: Repeat phrase (0/2) 2  Language : Fluency (0/1) 0  Abstraction (0/2) 1  Delayed Recall (0/5) 0   Orientation (0/6) 3  Total 8  Adjusted Score (based on education) 9   Cranial nerves: CN I: not tested CN II: pupils equal, round and reactive to light, visual fields intact, fundi unremarkable, without vessel changes, exudates, hemorrhages or papilledema. CN III, IV, VI:  Difficulty with tracking, dysconjugate gaze, no nystagmus, no ptosis CN V: facial sensation intact CN VII: upper and lower face symmetric CN VIII: hearing intact CN IX, X: gag intact, uvula midline CN XI: sternocleidomastoid and trapezius muscles intact CN XII: tongue midline Bulk & Tone: normal, no fasciculations. Motor:  5/5 throughout  Sensation:  Pinprick decreased in left upper and lower extremities.  Vibration sensation reduced in feet. Deep Tendon Reflexes:  2+ throughout, toes downgoing. Finger to nose testing:  Without dysmetria.  Heel to shin:  Without dysmetria.  Gait:  Normal station and stride.  Able to turn.  Some difficulty with tandem walking. Romberg negative.  IMPRESSION: Mixed vascular and Alzheimer's dementia Cerebrovascular disease Elevated blood pressure Tobacco abuse  PLAN: 1.  Add Namenda XR to Aricept.  Starter pack provided 2.  Repeat MRI of brain without contrast to evaluate for progression of cerebrovascular disease 3.  Carotid doppler and 2D echo  4.  I explicitly stated that he should  not be driving to patient and his family. 5.  Advised that he wear a life-alert device in case he falls while home alone 6.  Discussed smoking cessation 7.  Blood pressure a little elevated today.  Should have rechecked with PCP. 8.  Follow up in 6 months.  Thank you for allowing me to take part in the care of this patient.  Shon Millet, DO  CC:  Lynnea Ferrier, MD

## 2014-12-26 ENCOUNTER — Other Ambulatory Visit: Payer: Medicare HMO

## 2014-12-27 ENCOUNTER — Other Ambulatory Visit (HOSPITAL_COMMUNITY): Payer: Medicare HMO

## 2014-12-27 ENCOUNTER — Inpatient Hospital Stay: Admission: RE | Admit: 2014-12-27 | Payer: Medicare HMO | Source: Ambulatory Visit

## 2014-12-30 ENCOUNTER — Other Ambulatory Visit: Payer: Medicare HMO

## 2015-06-23 ENCOUNTER — Ambulatory Visit: Payer: Medicare HMO | Admitting: Neurology

## 2015-06-23 DIAGNOSIS — Z029 Encounter for administrative examinations, unspecified: Secondary | ICD-10-CM

## 2015-09-14 ENCOUNTER — Emergency Department (HOSPITAL_COMMUNITY): Payer: Medicare HMO

## 2015-09-14 ENCOUNTER — Encounter (HOSPITAL_COMMUNITY): Payer: Self-pay | Admitting: *Deleted

## 2015-09-14 ENCOUNTER — Emergency Department (HOSPITAL_COMMUNITY)
Admission: EM | Admit: 2015-09-14 | Discharge: 2015-09-14 | Disposition: A | Discharge status: Home / Self Care | Payer: Medicare HMO | Attending: Emergency Medicine | Admitting: Emergency Medicine

## 2015-09-14 DIAGNOSIS — R791 Abnormal coagulation profile: Secondary | ICD-10-CM | POA: Insufficient documentation

## 2015-09-14 DIAGNOSIS — R260 Ataxic gait: Secondary | ICD-10-CM | POA: Insufficient documentation

## 2015-09-14 DIAGNOSIS — F0390 Unspecified dementia without behavioral disturbance: Secondary | ICD-10-CM | POA: Insufficient documentation

## 2015-09-14 DIAGNOSIS — F17210 Nicotine dependence, cigarettes, uncomplicated: Secondary | ICD-10-CM | POA: Diagnosis not present

## 2015-09-14 DIAGNOSIS — R531 Weakness: Secondary | ICD-10-CM | POA: Diagnosis present

## 2015-09-14 DIAGNOSIS — Z79899 Other long term (current) drug therapy: Secondary | ICD-10-CM | POA: Diagnosis not present

## 2015-09-14 LAB — DIFFERENTIAL
Basophils Absolute: 0 10*3/uL (ref 0.0–0.1)
Basophils Relative: 0 %
EOS PCT: 1 %
Eosinophils Absolute: 0.1 10*3/uL (ref 0.0–0.7)
LYMPHS ABS: 1.1 10*3/uL (ref 0.7–4.0)
LYMPHS PCT: 13 %
MONO ABS: 0.9 10*3/uL (ref 0.1–1.0)
Monocytes Relative: 11 %
Neutro Abs: 6.1 10*3/uL (ref 1.7–7.7)
Neutrophils Relative %: 75 %

## 2015-09-14 LAB — COMPREHENSIVE METABOLIC PANEL
ALBUMIN: 3.8 g/dL (ref 3.5–5.0)
ALK PHOS: 93 U/L (ref 38–126)
ALT: 15 U/L — ABNORMAL LOW (ref 17–63)
ANION GAP: 9 (ref 5–15)
AST: 27 U/L (ref 15–41)
BILIRUBIN TOTAL: 1.3 mg/dL — AB (ref 0.3–1.2)
BUN: 11 mg/dL (ref 6–20)
CALCIUM: 9.5 mg/dL (ref 8.9–10.3)
CO2: 25 mmol/L (ref 22–32)
Chloride: 102 mmol/L (ref 101–111)
Creatinine, Ser: 0.78 mg/dL (ref 0.61–1.24)
GFR calc Af Amer: >60 mL/min (ref >60)
GFR calc non Af Amer: >60 mL/min (ref >60)
GLUCOSE: 97 mg/dL (ref 65–99)
Potassium: 3.4 mmol/L — ABNORMAL LOW (ref 3.5–5.1)
Sodium: 136 mmol/L (ref 135–145)
TOTAL PROTEIN: 7.7 g/dL (ref 6.5–8.1)

## 2015-09-14 LAB — URINALYSIS, ROUTINE W REFLEX MICROSCOPIC
GLUCOSE, UA: NEGATIVE mg/dL
HGB URINE DIPSTICK: NEGATIVE
Ketones, ur: 15 mg/dL — AB
Leukocytes, UA: NEGATIVE
Nitrite: NEGATIVE
PH: 5.5 (ref 5.0–8.0)
Protein, ur: NEGATIVE mg/dL
SPECIFIC GRAVITY, URINE: 1.026 (ref 1.005–1.030)

## 2015-09-14 LAB — I-STAT TROPONIN, ED: Troponin i, poc: 0 ng/mL (ref 0.00–0.08)

## 2015-09-14 LAB — PROTIME-INR
INR: 1.08 (ref 0.00–1.49)
Prothrombin Time: 14.2 seconds (ref 11.6–15.2)

## 2015-09-14 LAB — CBC
HEMATOCRIT: 46.7 % (ref 39.0–52.0)
HEMOGLOBIN: 15.9 g/dL (ref 13.0–17.0)
MCH: 29.4 pg (ref 26.0–34.0)
MCHC: 34 g/dL (ref 30.0–36.0)
MCV: 86.5 fL (ref 78.0–100.0)
Platelets: 146 10*3/uL — ABNORMAL LOW (ref 150–400)
RBC: 5.4 MIL/uL (ref 4.22–5.81)
RDW: 14.1 % (ref 11.5–15.5)
WBC: 8.2 10*3/uL (ref 4.0–10.5)

## 2015-09-14 LAB — CK: CK TOTAL: 177 U/L (ref 49–397)

## 2015-09-14 NOTE — Progress Notes (Signed)
CSW provided pt wife with a list of ALF information for [facility].    Ernestina Columbia, LCSWA [facility] Clinical Social Worker [phone]

## 2015-09-14 NOTE — ED Notes (Signed)
PT wife reports finding him siting on floor this AM . Pt was also found by wife on Tuesday 08-30-15  Sitting on couch witth a bloody nose. Wife reported PT fell and hit nose on coffee table.

## 2015-09-14 NOTE — Care Management Note (Signed)
Case Management Note  Patient Details  Name: Terry Francis MRN: [id number] Date of Birth: Sep 08, 1945  Subjective/Objective:    70 y.o. M seen in the ED for freq falls over the past several weeks. Pt has PCP and Neurologist that he has not seen since December.  Spouse works nights and sleeps during the day. Pt is falling and hitting his head, falling through glass furniture, etc... Spouse is tearful and states she was unable to get pt out of the floor when she awoke this am prior to coming here to [facility]. Will have HHPT, OT, and SW go to home to assess needs and assist with care. Spouse does have adult children who can assist. Offered ALFs in [facility] and that she should begin to look at her financial resources and look toward placement in the near future.                  Action/Plan: Discharge Home with Family. Referred to [facility] Spoke with Ayesha Rumpf.    Expected Discharge Date:                  Expected Discharge Plan:  Home w Home Health Services  In-House Referral:  Clinical Social Work  Discharge planning Services  CM Consult  Post Acute Care Choice:    Choice offered to:  Spouse  DME Arranged:    DME Agency:     HH Arranged:  PT, Social Work, OT HH Agency:  [facility] (now [facility])  Status of Service:  Completed, signed off  If discussed at [employer], dates discussed:    Additional Comments:  Yvone Neu, RN 09/14/2015, 4:26 PM

## 2015-09-14 NOTE — ED Notes (Signed)
PT waiting for Family to pick him up for transport home. Pt is non ambulatory and has dementia.

## 2015-09-14 NOTE — ED Notes (Signed)
Pt arrives via [facility], ems reports patient had a fall earlier this am and was seen by another medic truck but refused transport, ems was called out again for increasing generalized weakness. Ems reports pt is normally ambulatory but is having difficulty standing today, pts wife reports worsening of patient's dementia recently.

## 2015-09-14 NOTE — ED Notes (Signed)
PT unable to ambulate PT in hall. Pt assisted by 2 staff members and walker. Pt rocking back on heels and unable to maintain a upright position.  Pt unable to use walker.

## 2015-09-14 NOTE — Discharge Instructions (Signed)
See the primary care doctor as soon as possible. We have requested home aide services to help you out. Please return to the ER if your symptoms worsen.   Dementia Dementia is a general term for problems with brain function. A person with dementia has memory loss and a hard time with at least one other brain function such as thinking, speaking, or problem solving. Dementia can affect social functioning, how you do your job, your mood, or your personality. The changes may be hidden for a long time. The earliest forms of this disease are usually not detected by family or friends. Dementia can be:  Irreversible.  Potentially reversible.  Partially reversible.  Progressive. This means it can get worse over time. CAUSES  Irreversible dementia causes may include:  Degeneration of brain cells (Alzheimer disease or Lewy body dementia).  Multiple small strokes (vascular dementia).  Infection (chronic meningitis or Creutzfeldt-Jakob disease).  Frontotemporal dementia. This affects younger people, age 30 to 76, compared to those who have Alzheimer disease.  Dementia associated with other disorders like Parkinson disease, Huntington disease, or HIV-associated dementia. Potentially or partially reversible dementia causes may include:  Medicines.  Metabolic causes such as excessive alcohol intake, vitamin B12 deficiency, or thyroid disease.  Masses or pressure in the brain such as a tumor, blood clot, or hydrocephalus. SIGNS AND SYMPTOMS  Symptoms are often hard to detect. Family members or coworkers may not notice them early in the disease process. Different people with dementia may have different symptoms. Symptoms can include:  A hard time with memory, especially recent memory. Long-term memory may not be impaired.  Asking the same question multiple times or forgetting something someone just said.  A hard time speaking your thoughts or finding certain words.  A hard time solving  problems or performing familiar tasks (such as how to use a telephone).  Sudden changes in mood.  Changes in personality, especially increasing moodiness or mistrust.  Depression.  A hard time understanding complex ideas that were never a problem in the past. DIAGNOSIS  There are no specific tests for dementia.   Your health care provider may recommend a thorough evaluation. This is because some forms of dementia can be reversible. The evaluation will likely include a physical exam and getting a detailed history from you and a family member. The history often gives the best clues and suggestions for a diagnosis.  Memory testing may be done. A detailed brain function evaluation called neuropsychologic testing may be helpful.  Lab tests and brain imaging (such as a CT scan or MRI scan) are sometimes important.  Sometimes observation and re-evaluation over time is very helpful. TREATMENT  Treatment depends on the cause.   If the problem is a vitamin deficiency, it may be helped or cured with supplements.  For dementias such as Alzheimer disease, medicines are available to stabilize or slow the course of the disease. There are no cures for this type of dementia.  Your health care provider can help direct you to groups, organizations, and other health care providers to help with decisions in the care of you or your loved one. HOME CARE INSTRUCTIONS The care of individuals with dementia is varied and dependent upon the progression of the dementia. The following suggestions are intended for the person living with, or caring for, the person with dementia.  Create a safe environment.  Remove the locks on bathroom doors to prevent the person from accidentally locking himself or herself in.  Use childproof latches on  kitchen cabinets and any place where cleaning supplies, chemicals, or alcohol are kept.  Use childproof covers in unused electrical outlets.  Install childproof devices to  keep doors and windows secured.  Remove stove knobs or install safety knobs and an automatic shut-off on the stove.  Lower the temperature on water heaters.  Label medicines and keep them locked up.  Secure knives, lighters, matches, power tools, and guns, and keep these items out of reach.  Keep the house free from clutter. Remove rugs or anything that might contribute to a fall.  Remove objects that might break and hurt the person.  Make sure lighting is good, both inside and outside.  Install grab rails as needed.  Use a monitoring device to alert you to falls or other needs for help.  Reduce confusion.  Keep familiar objects and people around.  Use night lights or dim lights at night.  Label items or areas.  Use reminders, notes, or directions for daily activities or tasks.  Keep a simple, consistent routine for waking, meals, bathing, dressing, and bedtime.  Create a calm, quiet environment.  Place large clocks and calendars prominently.  Display emergency numbers and home address near all telephones.  Use cues to establish different times of the day. An example is to open curtains to let the natural light in during the day.   Use effective communication.  Choose simple words and short sentences.  Use a gentle, calm tone of voice.  Be careful not to interrupt.  If the person is struggling to find a word or communicate a thought, try to provide the word or thought.  Ask one question at a time. Allow the person ample time to answer questions. Repeat the question again if the person does not respond.  Reduce nighttime restlessness.  Provide a comfortable bed.  Have a consistent nighttime routine.  Ensure a regular walking or physical activity schedule. Involve the person in daily activities as much as possible.  Limit napping during the day.  Limit caffeine.  Attend social events that stimulate rather than overwhelm the senses.  Encourage good  nutrition and hydration.  Reduce distractions during meal times and snacks.  Avoid foods that are too hot or too cold.  Monitor chewing and swallowing ability.  Continue with routine vision, hearing, dental, and medical screenings.  Give medicines only as directed by the health care provider.  Monitor driving abilities. Do not allow the person to drive when safe driving is no longer possible.  Register with an identification program which could provide location assistance in the event of a missing person situation. SEEK MEDICAL CARE IF:   New behavioral problems start such as moodiness, aggressiveness, or seeing things that are not there (hallucinations).  Any new problem with brain function happens. This includes problems with balance, speech, or falling a lot.  Problems with swallowing develop.  Any symptoms of other illness happen. Small changes or worsening in any aspect of brain function can be a sign that the illness is getting worse. It can also be a sign of another medical illness such as infection. Seeing a health care provider right away is important. SEEK IMMEDIATE MEDICAL CARE IF:   A fever develops.  New or worsened confusion develops.  New or worsened sleepiness develops.  Staying awake becomes hard to do.   This information is not intended to replace advice given to you by your health care provider. Make sure you discuss any questions you have with your health care  provider.   Document Released: 08/11/2000 Document Revised: 03/08/2014 Document Reviewed: 07/13/2010 Elsevier Interactive Patient Education [employer]

## 2015-09-14 NOTE — ED Provider Notes (Signed)
CSN: [id number]     Arrival date & time 09/14/15  1215 History   First MD Initiated Contact with Patient 09/14/15 1233     Chief Complaint  Patient presents with  . Weakness     (Consider location/radiation/quality/duration/timing/severity/associated sxs/prior Treatment) HPI Comments: LEVEL 5 CAVEAT FOR FALLS  PT has dementia, otherwise relatively healthy. Lives with wife, who is still working. Per wife pt has been having more falls at home as of late. She reports that pt now frequently needs help from her to get around, and she is unable to keep up with the needs given that she works. No recent infection. PT reports no complains from his side.    Patient is a 70 y.o. male presenting with weakness. The history is provided by the patient and a relative.  Weakness    Past Medical History  Diagnosis Date  . RA (rheumatoid arthritis) (HCC)    History reviewed. No pertinent past surgical history. History reviewed. No pertinent family history. Social History  Substance Use Topics  . Smoking status: Current Every Day Smoker -- 1.00 packs/day for 50 years    Types: Cigarettes  . Smokeless tobacco: Never Used  . Alcohol Use: Yes    Review of Systems  Unable to perform ROS: Dementia  Neurological: Positive for weakness.      Allergies  Review of patient's allergies indicates no known allergies.  Home Medications   Prior to Admission medications   Medication Sig Start Date End Date Taking? Authorizing Provider  methotrexate (RHEUMATREX) 7.5 MG tablet Take 7.5 mg by mouth once a week. Caution" Chemotherapy. Protect from light.   Yes Historical Provider, MD  naproxen sodium (ANAPROX) 220 MG tablet Take 220 mg by mouth 2 (two) times daily as needed (pain).   Yes Historical Provider, MD  atorvastatin (LIPITOR) 20 MG tablet Take 1 tablet (20 mg total) by mouth daily. Patient not taking: Reported on 09/14/2015 06/26/14   Donita Brooks, MD  clopidogrel (PLAVIX) 75 MG tablet Take  1 tablet (75 mg total) by mouth daily. Patient not taking: Reported on 09/14/2015 11/12/14   Donita Brooks, MD  donepezil (ARICEPT) 10 MG tablet Take 1 tablet (10 mg total) by mouth at bedtime. Patient not taking: Reported on 09/14/2015 06/11/14   Donita Brooks, MD  meclizine (ANTIVERT) 25 MG tablet TAKE 1 TABLET BY MOUTH 3 TIMES DAILY AS NEEDED FOR DIZZINESS. Patient not taking: Reported on 09/14/2015 12/04/13   Salley Scarlet, MD  Memantine HCl ER (NAMENDA XR TITRATION PACK) 7 & 14 & 21 &28 MG CP24 Follow instructions on box Patient not taking: Reported on 09/14/2015 12/23/14   Drema Dallas, DO   BP 135/58 mmHg  Pulse 76  Temp(Src) 98 F (36.7 C) (Oral)  Resp 18  Ht 6' (1.829 m)  Wt 170 lb (77.111 kg)  BMI 23.05 kg/m2  SpO2 97% Physical Exam  Constitutional: He is oriented to person, place, and time. He appears well-developed.  HENT:  Head: Normocephalic and atraumatic.  Eyes: Conjunctivae and EOM are normal. Pupils are equal, round, and reactive to light.  Neck: Normal range of motion. Neck supple.  Cardiovascular: Normal rate and regular rhythm.   Pulmonary/Chest: Effort normal and breath sounds normal.  Abdominal: Soft. Bowel sounds are normal. He exhibits no distension. There is no tenderness. There is no rebound and no guarding.  Musculoskeletal:  Head to toe evaluation shows no hematoma, bleeding of the scalp, no facial abrasions, step offs, crepitus, no tenderness  to palpation of the bilateral upper and lower extremities, no gross deformities, no chest tenderness, no pelvic pain.   Neurological: He is alert and oriented to person, place, and time.  Skin: Skin is warm.  Nursing note and vitals reviewed.   ED Course  Procedures (including critical care time) Labs Review Labs Reviewed  CBC - Abnormal; Notable for the following:    Platelets 146 (*)    All other components within normal limits  COMPREHENSIVE METABOLIC PANEL - Abnormal; Notable for the following:     Potassium 3.4 (*)    ALT 15 (*)    Total Bilirubin 1.3 (*)    All other components within normal limits  URINALYSIS, ROUTINE W REFLEX MICROSCOPIC (NOT AT [facility]) - Abnormal; Notable for the following:    Color, Urine ORANGE (*)    Bilirubin Urine SMALL (*)    Ketones, ur 15 (*)    All other components within normal limits  PROTIME-INR  DIFFERENTIAL  CK  I-STAT TROPOININ, ED    Imaging Review Dg Chest 2 View  09/14/2015  CLINICAL DATA:  Increasing weakness. Difficulty standing. Worsening dementia. EXAM: CHEST  2 VIEW COMPARISON:  Two-view chest x-ray 12/23/2014. FINDINGS: The heart size is normal. Mild pulmonary vascular congestion is present without frank edema. There is no ill-defined left lower lobe airspace disease is noted posteriorly. No other significant airspace consolidation is present. A small left pleural effusion is present. The right lung base is clear. The visualized soft tissues and bony thorax are unremarkable. IMPRESSION: 1. Mild pulmonary vascular congestion. 2. Ill-defined left lower lobe airspace disease and small left pleural effusion. While this may represent atelectasis, infection is also considered. Electronically Signed   By: Marin Roberts M.D.   On: 09/14/2015 14:37   Ct Head Wo Contrast  09/14/2015  CLINICAL DATA:  Status post fall.  Multiple recent falls. EXAM: CT HEAD WITHOUT CONTRAST CT CERVICAL SPINE WITHOUT CONTRAST TECHNIQUE: Multidetector CT imaging of the head and cervical spine was performed following the standard protocol without intravenous contrast. Multiplanar CT image reconstructions of the cervical spine were also generated. COMPARISON:  MR brain 08/19/2013 FINDINGS: CT HEAD FINDINGS There is no evidence of mass effect, midline shift, or extra-axial fluid collections. There is no evidence of a space-occupying lesion or intracranial hemorrhage. There is no evidence of a cortical-based area of acute infarction. There is generalized cerebral atrophy.  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 There is periventricular white matter low attenuation likely secondary to microangiopathy. The ventricles and sulci are appropriate for the patient's age. The basal cisterns are patent. Visualized portions of the orbits are unremarkable. The visualized portions of the paranasal sinuses and mastoid air cells are unremarkable. Cerebrovascular atherosclerotic calcifications are noted. The osseous structures are unremarkable. CT CERVICAL SPINE FINDINGS The alignment is anatomic. The vertebral body heights are maintained. There is no acute fracture. There is no static listhesis. The prevertebral soft tissues are normal. The intraspinal soft tissues are not fully imaged on this examination due to poor soft tissue contrast, but there is no gross soft tissue abnormality. There is degenerative disc disease with disc height loss at C4-5, C5-6 and C6-7. There is bilateral facet arthropathy and uncovertebral degenerative changes C3-4 resulting in bilateral mild foraminal narrowing. There is bilateral facet arthropathy and uncovertebral degenerative change at C4-5 resulting in mild foraminal narrowing. There is bilateral uncovertebral degenerative change at C5-6 with mild foraminal narrowing.  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 The visualized portions of the lung apices demonstrate no focal abnormality. IMPRESSION: 1. No acute intracranial pathology. 2. No acute osseous injury of the cervical spine. Electronically Signed   By: Elige Ko   On: 09/14/2015 14:27   Ct Cervical Spine Wo Contrast  09/14/2015  CLINICAL DATA:  Status post fall.  Multiple recent falls. EXAM: CT HEAD WITHOUT CONTRAST CT CERVICAL SPINE WITHOUT CONTRAST TECHNIQUE: Multidetector CT imaging of the head and cervical spine was performed following the standard protocol without intravenous contrast. Multiplanar CT image  reconstructions of the cervical spine were also generated. COMPARISON:  MR brain 08/19/2013 FINDINGS: CT HEAD FINDINGS There is no evidence of mass effect, midline shift, or extra-axial fluid collections. There is no evidence of a space-occupying lesion or intracranial hemorrhage. There is no evidence of a cortical-based area of acute infarction. There is generalized cerebral atrophy. 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 There is periventricular white matter low attenuation likely secondary to microangiopathy. The ventricles and sulci are appropriate for the patient's age. The basal cisterns are patent. Visualized portions of the orbits are unremarkable. The visualized portions of the paranasal sinuses and mastoid air cells are unremarkable. Cerebrovascular atherosclerotic calcifications are noted. The osseous structures are unremarkable. CT CERVICAL SPINE FINDINGS The alignment is anatomic. The vertebral body heights are maintained. There is no acute fracture. There is no static listhesis. The prevertebral soft tissues are normal. The intraspinal soft tissues are not fully imaged on this examination due to poor soft tissue contrast, but there is no gross soft tissue abnormality. There is degenerative disc disease with disc height loss at C4-5, C5-6 and C6-7. There is bilateral facet arthropathy and uncovertebral degenerative changes C3-4 resulting in bilateral mild foraminal narrowing. There is bilateral facet arthropathy and uncovertebral degenerative change at C4-5 resulting in mild foraminal narrowing. There is bilateral uncovertebral degenerative change at C5-6 with mild foraminal narrowing. 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 The visualized portions of the lung apices demonstrate no focal abnormality. IMPRESSION: 1. No acute intracranial pathology. 2. No acute osseous injury of the cervical spine. Electronically  Signed   By: Elige Ko   On: 09/14/2015 14:27   I have personally reviewed and evaluated these images and lab results as part of my medical decision-making.   EKG Interpretation   Date/Time:  Sunday September 14 2015 12:32:52 EDT Ventricular Rate:  79 PR Interval:    QRS Duration: 95 QT Interval:  427 QTC Calculation: 490 R Axis:   23 Text Interpretation:  Sinus rhythm Borderline ST depression, lateral leads  Borderline prolonged QT interval No acute changes No significant change  since last tracing Confirmed by Rhunette Croft, MD, Janey Genta [phone]) on 09/14/2015  1:15:13 PM Also confirmed by Rhunette Croft, MD, Janey Genta [phone]), editor Whitney Post,  [place]a [place] [phone])  on 09/14/2015 2:01:18 PM      MDM   Final diagnoses:  Dementia, without behavioral disturbance  Ataxic gait    Pt comes in with cc of worsening gait and falls. Appropriate imaging ordered - pt had c/o neck pain and headache - so we will ensure there is no fracture or brain bleeds. Pt has no medical complains, and this seems to be just worsening dementia. He is quite healthy otherwise.  - Case consulted. She is aware that pt is having difficulty walking around. - Dr. Deretha Emory to f/u on recs. - Wife aware  that at the moment we have no admissible diagnosis.  Derwood Kaplan, MD 09/14/15 1755

## 2015-09-15 ENCOUNTER — Other Ambulatory Visit: Payer: Self-pay | Admitting: Family Medicine

## 2015-09-15 ENCOUNTER — Encounter: Payer: Self-pay | Admitting: Family Medicine

## 2015-09-15 ENCOUNTER — Telehealth: Payer: Self-pay | Admitting: Surgery

## 2015-09-15 ENCOUNTER — Ambulatory Visit (INDEPENDENT_AMBULATORY_CARE_PROVIDER_SITE_OTHER): Payer: Medicare HMO | Admitting: Family Medicine

## 2015-09-15 VITALS — BP 130/72 | HR 82 | Temp 98.0°F | Resp 20 | Wt 155.0 lb

## 2015-09-15 DIAGNOSIS — F0281 Dementia in other diseases classified elsewhere with behavioral disturbance: Secondary | ICD-10-CM

## 2015-09-15 DIAGNOSIS — I679 Cerebrovascular disease, unspecified: Secondary | ICD-10-CM

## 2015-09-15 DIAGNOSIS — F0391 Unspecified dementia with behavioral disturbance: Secondary | ICD-10-CM | POA: Diagnosis not present

## 2015-09-15 DIAGNOSIS — R413 Other amnesia: Secondary | ICD-10-CM

## 2015-09-15 DIAGNOSIS — R296 Repeated falls: Secondary | ICD-10-CM

## 2015-09-15 DIAGNOSIS — F02818 Dementia in other diseases classified elsewhere, unspecified severity, with other behavioral disturbance: Secondary | ICD-10-CM

## 2015-09-15 DIAGNOSIS — Z8673 Personal history of transient ischemic attack (TIA), and cerebral infarction without residual deficits: Secondary | ICD-10-CM

## 2015-09-15 DIAGNOSIS — G308 Other Alzheimer's disease: Principal | ICD-10-CM

## 2015-09-15 DIAGNOSIS — F03918 Unspecified dementia, unspecified severity, with other behavioral disturbance: Secondary | ICD-10-CM

## 2015-09-15 NOTE — Telephone Encounter (Signed)
ED CM received call from Philis [facility] stating patient's insurance is not accepted by [facility] so they will not be able to deliver services unless patient self pays. CM contacted patient and wife to inform them, with other options. Patient and wife agreeable to send referral to [facility]. Referral faxed to [facility] 336 [phone], fax confirmation received.  No further CM need identified.

## 2015-09-16 ENCOUNTER — Encounter: Payer: Self-pay | Admitting: Family Medicine

## 2015-09-16 ENCOUNTER — Telehealth: Payer: Self-pay | Admitting: *Deleted

## 2015-09-16 NOTE — Progress Notes (Signed)
Subjective:    Patient ID: Terry Francis, male    DOB: 04-10-1945, 70 y.o.   MRN: [id number]  HPI  06/11/14 Patient is here today accompanied by his wife.  He is complaining of dizziness. Every morning when he wakes up he staggers getting out of the bed due to a sense of imbalance.  He also has some mild vertigo. He was treated with meclizine without relief last year. 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  Patient is not taking aspirin. He continues to smoke. He is on no statin medication. However his wife is more concerned by his confusion. She states that every few days he will become very confused and disoriented. For example he will walk around the house naked for no reason. When she tells him to go put on his housecoat, the patient will return wearing a dress shirt. He is unsure of the date and the time of day. She frequently has to remind him of things. Has to repeat what she told him because he forgets what she told him. At times he is delirious. The symptoms will gradually improve over a few days and return to the patient's baseline. Today the patient is unable to tell me what month it is, what year it is. He does know the day.  He is able to tell me the location. He is able to remember 3 objects on recall only after 4 repetitions.  Patient is illiterate and therefore cannot perform WORLD test.  However on serial sevens he is unable to calculate 1 number. This is a patient who worked years in [employer] and was constantly performing math addition and subtraction in his head. The remainder of his Mini-Mental status exam was normal except when asked the patient to draw a clock, he was able to space the numbers correctly however he was unable to draw the hands on the clock to make it say  4:30. However he is able to read the clock today and tell me what time it is.    At that time, my plan was: Patient  scores 23 out of 30 on his Mini-Mental status exam. I believe he has dementia with episodes of delirium. I believe it is likely vascular dementia. Therefore I recommended the patient begin Aricept 5 mg a day and in one week if he is tolerating that dose increased to 10 mg a day. As the patient tolerates I would quickly add Namenda to his medicine regimen. I believe his imbalance is likely due to a remote cerebellar strokes. Therefore I believe we need to focus our attention on secondary stroke prevention. I recommended the patient take aspirin 325 mg by mouth daily. I recommended smoking cessation. I would like him to return fasting in the morning so I can check a fasting lipid panel with a goal LDL cholesterol less than 70. I will start the patient on his statin medication if his cholesterol is greater than 70. I will also consult neurology for a second opinion.  On previous MRI there was no evidence of normal pressure hydrocephalus  11/12/14 He is here today for follow-up. [place] is [place] his daughter and his wife. He continues to smoke heavily with no desire to quit.  He also stopped taking his aspirin. Recently his balance has worsened. He is falling more frequently. During our visit today, I had patient stand up from a chair and he  almost fell into the cabinet. I walked him around our office. He has a short stride with shuffling gait. There is no pill-rolling tremor but he does have a masklike facies and slowed reaction time.. Of note his MRI showed chronic microvascular damage in the basal ganglia which may be contributing some to Parkinson-like features. He is taking the Aricept for memory as well as the Lipitor for cholesterol. His biggest concern is his continued worsening balance and his frequent falls. He would also like a referral to a different rheumatologist to help manage his rheumatoid arthritis. His previous rheumatologist left the area.  Of note I had scheduled him to see a neurologist in July  and he missed that appointment but this is mainly due to his memory problems.  At that time, my plan was: I suspect the patient has had another small stroke. Therefore I will discontinue aspirin and I will start the patient on Plavix 75 mg by mouth daily. I believe he would benefit from another MRI but I'm going to go ahead and schedule the patient to see a neurologist and I would expect that they will perform one there at their office and therefore I will defer that to them. I will check a CMP to make sure that his hyponatremia has not worsened and I will also check a TSH to make sure his hypothyroidism has not worsened. I strongly encouraged him to quit smoking. I explained that his microvascular brain damage is causing his memory problems as well as his problems with balance as he has had strokes in his basal ganglia as well as in his cerebellum. I believe he would benefit from physical therapy for the neurologist office. 09/15/15 The patient is here today with his wife as well as his daughter. He is noncompliant. He refuses to take any medication. He refuses to follow-up with neurology or physical therapy. Since last time I saw him, his medical problems have worsened dramatically. Today he appears disheveled and confused. I performed a Mini-Mental status exam. He is unable to tell me the month date year or even the season.  He is unable to remember any of 3 objects on rapid recall. He is unable to perform serial sevens or spell world in reverse. He is falling frequently. He is unable to stand without assistance. His wife works during the day and she frequently will come home and find that he has fallen. He will be laying in the 4 or laying slumped over a chair. They deny syncope. He has stopped his antiplatelet agents. He refuses to take his cholesterol medication. They had to lie to get him to the doctor's appointment today. Recently he fell and he was unable to get him out of the forceps of a call 911. He  was taken to the emergency room. CBC and CMP were unremarkable. CT scan showed no mass effect or intracranial hemorrhage. However there are chronic stroke seen in the right cerebellum that I was aware of but also another stroke seen in the thalamus. I suspect that he has had more strokes causing progressive brain damage and worsening his memory loss and balance. He is now unsafe to be at home without 24-hour day, 7 day a week supervision and care. He needs extensive physical therapy as he can barely stand and he is unable to walk without the holding his arm and holding him by his belt.  Past Medical History  Diagnosis Date  . RA (rheumatoid arthritis) (HCC)    No past  surgical history on file. Current Outpatient Prescriptions on File Prior to Visit  Medication Sig Dispense Refill  . atorvastatin (LIPITOR) 20 MG tablet Take 1 tablet (20 mg total) by mouth daily. (Patient not taking: Reported on 09/14/2015) 30 tablet 3  . clopidogrel (PLAVIX) 75 MG tablet Take 1 tablet (75 mg total) by mouth daily. (Patient not taking: Reported on 09/14/2015) 90 tablet 3  . donepezil (ARICEPT) 10 MG tablet Take 1 tablet (10 mg total) by mouth at bedtime. (Patient not taking: Reported on 09/14/2015) 30 tablet 5  . meclizine (ANTIVERT) 25 MG tablet TAKE 1 TABLET BY MOUTH 3 TIMES DAILY AS NEEDED FOR DIZZINESS. (Patient not taking: Reported on 09/14/2015) 30 tablet 1  . Memantine HCl ER (NAMENDA XR TITRATION PACK) 7 & 14 & 21 &28 MG CP24 Follow instructions on box (Patient not taking: Reported on 09/14/2015) 28 capsule 0  . methotrexate (RHEUMATREX) 7.5 MG tablet Take 7.5 mg by mouth once a week. Reported on 09/15/2015    . naproxen sodium (ANAPROX) 220 MG tablet Take 220 mg by mouth 2 (two) times daily as needed (pain). Reported on 09/15/2015     No current facility-administered medications on file prior to visit.   No Known Allergies Social History   Social History  . Marital Status: Married    Spouse Name: N/A  .  Number of Children: N/A  . Years of Education: N/A   Occupational History  . Not on file.   Social History Main Topics  . Smoking status: Current Every Day Smoker -- 1.00 packs/day for 50 years    Types: Cigarettes  . Smokeless tobacco: Never Used  . Alcohol Use: Yes  . Drug Use: No  . Sexual Activity: Yes   Other Topics Concern  . Not on file   Social History Narrative   Pt lives with his wife, [name], in a one story home. He completed the 7th grade.      Review of Systems  All other systems reviewed and are negative.      Objective:   Physical Exam  Constitutional: He is oriented to person, place, and time.  Cardiovascular: Normal rate, regular rhythm and normal heart sounds.   No murmur heard. Pulmonary/Chest: Effort normal. He has wheezes.  Abdominal: Soft. Bowel sounds are normal. He exhibits no distension. There is no tenderness. There is no rebound and no guarding.  Neurological: He is alert and oriented to person, place, and time. He has normal reflexes. No cranial nerve deficit. He exhibits abnormal muscle tone. Coordination abnormal.  Psychiatric: He has a normal mood and affect. Judgment and thought content normal. He is withdrawn. Cognition and memory are impaired. He exhibits abnormal recent memory and abnormal remote memory.  Vitals reviewed.         Assessment & Plan:  Memory loss - Plan: MR Brain W Wo Contrast  History of stroke - Plan: MR Brain W Wo Contrast  Cerebrovascular small vessel disease - Plan: MR Brain W Wo Contrast  Dementia with behavioral disturbance - Plan: MR Brain W Wo Contrast  Falls frequently - Plan: MR Brain W Wo Contrast   Patient has chronic microvascular damage in the brain causing vascular dementia. This is apparently worsened. Due to his noncompliance with his antiplatelet agents and his statin medication, I suspect that he has had further strokes worsening his balance and causing his ataxia and contributing to his  falls. Furthermore his become extremely deconditioned and weak and is unable to stand or walk  without great difficulty. He is extremely high fall risk. Due to his dementia, his deconditioning, and his neurologic deficits, he needs 24/7 care which the family is unable to provide at home. Therefore I will consult social work to assist in placement in a nursing home. I will schedule an MRI of the brain to evaluate for new CVA. I recommended the patient take Plavix as well as statin medication but the family states that they're unable to get him to take any medication even hiding it in his food. I believe he would benefit from physical therapy at the nursing home. I'll also check a urinalysis to rule out sources of delirium

## 2015-09-22 ENCOUNTER — Telehealth: Payer: Self-pay | Admitting: *Deleted

## 2015-09-22 NOTE — Telephone Encounter (Signed)
Noted PCP is Pickard

## 2015-09-22 NOTE — Telephone Encounter (Signed)
Received call from [place], [facility] SN with [facility].   Requested VO to continue [facility] PT services 1x 1 week, and 2x 8 weeks for gait, balance, and positioning.   VO given.   MD to be made aware.

## 2015-10-06 ENCOUNTER — Ambulatory Visit
Admission: RE | Admit: 2015-10-06 | Discharge: 2015-10-06 | Disposition: A | Discharge status: Home / Self Care | Payer: Medicare HMO | Source: Ambulatory Visit | Attending: Family Medicine | Admitting: Family Medicine

## 2015-10-06 DIAGNOSIS — F03918 Unspecified dementia, unspecified severity, with other behavioral disturbance: Secondary | ICD-10-CM

## 2015-10-06 DIAGNOSIS — I679 Cerebrovascular disease, unspecified: Secondary | ICD-10-CM

## 2015-10-06 DIAGNOSIS — F0391 Unspecified dementia with behavioral disturbance: Secondary | ICD-10-CM

## 2015-10-06 DIAGNOSIS — Z8673 Personal history of transient ischemic attack (TIA), and cerebral infarction without residual deficits: Secondary | ICD-10-CM

## 2015-10-06 DIAGNOSIS — R413 Other amnesia: Secondary | ICD-10-CM

## 2015-10-06 DIAGNOSIS — R296 Repeated falls: Secondary | ICD-10-CM

## 2015-10-06 MED ORDER — GADOBENATE DIMEGLUMINE 529 MG/ML IV SOLN
15.0000 mL | Freq: Once | INTRAVENOUS | Status: AC | PRN
  Administered 2015-10-06: 15 mL via INTRAVENOUS

## 2015-10-08 ENCOUNTER — Encounter (HOSPITAL_COMMUNITY): Payer: Self-pay | Admitting: Neurology

## 2015-10-08 ENCOUNTER — Inpatient Hospital Stay (HOSPITAL_COMMUNITY)
Admission: EM | Admit: 2015-10-08 | Discharge: 2015-10-11 | DRG: 065 | Disposition: A | Discharge status: Skilled Nursing Facility | Payer: Medicare HMO | Attending: Internal Medicine | Admitting: Internal Medicine

## 2015-10-08 ENCOUNTER — Emergency Department (HOSPITAL_COMMUNITY): Payer: Medicare HMO

## 2015-10-08 DIAGNOSIS — F0280 Dementia in other diseases classified elsewhere without behavioral disturbance: Secondary | ICD-10-CM | POA: Diagnosis present

## 2015-10-08 DIAGNOSIS — Z515 Encounter for palliative care: Secondary | ICD-10-CM

## 2015-10-08 DIAGNOSIS — I6310 Cerebral infarction due to embolism of unspecified precerebral artery: Secondary | ICD-10-CM | POA: Insufficient documentation

## 2015-10-08 DIAGNOSIS — I639 Cerebral infarction, unspecified: Principal | ICD-10-CM | POA: Diagnosis present

## 2015-10-08 DIAGNOSIS — I10 Essential (primary) hypertension: Secondary | ICD-10-CM

## 2015-10-08 DIAGNOSIS — I6340 Cerebral infarction due to embolism of unspecified cerebral artery: Secondary | ICD-10-CM

## 2015-10-08 DIAGNOSIS — Z66 Do not resuscitate: Secondary | ICD-10-CM | POA: Diagnosis present

## 2015-10-08 DIAGNOSIS — Z7902 Long term (current) use of antithrombotics/antiplatelets: Secondary | ICD-10-CM

## 2015-10-08 DIAGNOSIS — G309 Alzheimer's disease, unspecified: Secondary | ICD-10-CM

## 2015-10-08 DIAGNOSIS — E785 Hyperlipidemia, unspecified: Secondary | ICD-10-CM | POA: Diagnosis not present

## 2015-10-08 DIAGNOSIS — R627 Adult failure to thrive: Secondary | ICD-10-CM | POA: Diagnosis present

## 2015-10-08 DIAGNOSIS — S41112A Laceration without foreign body of left upper arm, initial encounter: Secondary | ICD-10-CM | POA: Diagnosis present

## 2015-10-08 DIAGNOSIS — Z79899 Other long term (current) drug therapy: Secondary | ICD-10-CM

## 2015-10-08 DIAGNOSIS — Z9114 Patient's other noncompliance with medication regimen: Secondary | ICD-10-CM

## 2015-10-08 DIAGNOSIS — E039 Hypothyroidism, unspecified: Secondary | ICD-10-CM | POA: Diagnosis present

## 2015-10-08 DIAGNOSIS — M069 Rheumatoid arthritis, unspecified: Secondary | ICD-10-CM | POA: Diagnosis present

## 2015-10-08 DIAGNOSIS — F0151 Vascular dementia with behavioral disturbance: Secondary | ICD-10-CM | POA: Diagnosis present

## 2015-10-08 DIAGNOSIS — R296 Repeated falls: Secondary | ICD-10-CM

## 2015-10-08 DIAGNOSIS — F0281 Dementia in other diseases classified elsewhere with behavioral disturbance: Secondary | ICD-10-CM | POA: Diagnosis present

## 2015-10-08 DIAGNOSIS — Z8673 Personal history of transient ischemic attack (TIA), and cerebral infarction without residual deficits: Secondary | ICD-10-CM

## 2015-10-08 DIAGNOSIS — E038 Other specified hypothyroidism: Secondary | ICD-10-CM | POA: Diagnosis present

## 2015-10-08 DIAGNOSIS — R55 Syncope and collapse: Secondary | ICD-10-CM | POA: Diagnosis present

## 2015-10-08 DIAGNOSIS — F0150 Vascular dementia without behavioral disturbance: Secondary | ICD-10-CM

## 2015-10-08 DIAGNOSIS — E860 Dehydration: Secondary | ICD-10-CM | POA: Diagnosis present

## 2015-10-08 DIAGNOSIS — F17210 Nicotine dependence, cigarettes, uncomplicated: Secondary | ICD-10-CM | POA: Diagnosis present

## 2015-10-08 DIAGNOSIS — M79622 Pain in left upper arm: Secondary | ICD-10-CM

## 2015-10-08 DIAGNOSIS — Z9181 History of falling: Secondary | ICD-10-CM

## 2015-10-08 DIAGNOSIS — S0011XA Contusion of right eyelid and periocular area, initial encounter: Secondary | ICD-10-CM | POA: Diagnosis present

## 2015-10-08 DIAGNOSIS — E876 Hypokalemia: Secondary | ICD-10-CM | POA: Diagnosis present

## 2015-10-08 DIAGNOSIS — W19XXXA Unspecified fall, initial encounter: Secondary | ICD-10-CM | POA: Diagnosis present

## 2015-10-08 DIAGNOSIS — R17 Unspecified jaundice: Secondary | ICD-10-CM | POA: Diagnosis present

## 2015-10-08 LAB — COMPREHENSIVE METABOLIC PANEL
ALBUMIN: 3.3 g/dL — AB (ref 3.5–5.0)
ALT: 13 U/L — ABNORMAL LOW (ref 17–63)
ANION GAP: 8 (ref 5–15)
AST: 31 U/L (ref 15–41)
Alkaline Phosphatase: 81 U/L (ref 38–126)
BUN: 7 mg/dL (ref 6–20)
CHLORIDE: 100 mmol/L — AB (ref 101–111)
CO2: 28 mmol/L (ref 22–32)
Calcium: 9 mg/dL (ref 8.9–10.3)
Creatinine, Ser: 0.81 mg/dL (ref 0.61–1.24)
GFR calc Af Amer: >60 mL/min (ref >60)
GFR calc non Af Amer: >60 mL/min (ref >60)
GLUCOSE: 91 mg/dL (ref 65–99)
POTASSIUM: 3.1 mmol/L — AB (ref 3.5–5.1)
SODIUM: 136 mmol/L (ref 135–145)
Total Bilirubin: 1.2 mg/dL (ref 0.3–1.2)
Total Protein: 6.8 g/dL (ref 6.5–8.1)

## 2015-10-08 LAB — CBC WITH DIFFERENTIAL/PLATELET
BASOS ABS: 0 10*3/uL (ref 0.0–0.1)
BASOS PCT: 0 %
EOS ABS: 0.1 10*3/uL (ref 0.0–0.7)
Eosinophils Relative: 1 %
HEMATOCRIT: 44.3 % (ref 39.0–52.0)
Hemoglobin: 14.9 g/dL (ref 13.0–17.0)
Lymphocytes Relative: 18 %
Lymphs Abs: 1.4 10*3/uL (ref 0.7–4.0)
MCH: 29.3 pg (ref 26.0–34.0)
MCHC: 33.6 g/dL (ref 30.0–36.0)
MCV: 87 fL (ref 78.0–100.0)
MONO ABS: 0.5 10*3/uL (ref 0.1–1.0)
Monocytes Relative: 6 %
NEUTROS ABS: 5.6 10*3/uL (ref 1.7–7.7)
Neutrophils Relative %: 75 %
PLATELETS: 129 10*3/uL — AB (ref 150–400)
RBC: 5.09 MIL/uL (ref 4.22–5.81)
RDW: 14.1 % (ref 11.5–15.5)
WBC: 7.5 10*3/uL (ref 4.0–10.5)

## 2015-10-08 LAB — URINALYSIS, ROUTINE W REFLEX MICROSCOPIC
GLUCOSE, UA: NEGATIVE mg/dL
HGB URINE DIPSTICK: NEGATIVE
Ketones, ur: NEGATIVE mg/dL
LEUKOCYTES UA: NEGATIVE
Nitrite: NEGATIVE
Protein, ur: NEGATIVE mg/dL
SPECIFIC GRAVITY, URINE: 1.023 (ref 1.005–1.030)
pH: 7 (ref 5.0–8.0)

## 2015-10-08 LAB — CK
CK TOTAL: 663 U/L — AB (ref 49–397)
Total CK: 718 U/L — ABNORMAL HIGH (ref 49–397)

## 2015-10-08 LAB — TROPONIN I: Troponin I: <0.03 ng/mL (ref <0.03)

## 2015-10-08 MED ORDER — ASPIRIN 300 MG RE SUPP: 300.0000 mg | Freq: Every day | RECTAL | Status: DC

## 2015-10-08 MED ORDER — HYDRALAZINE HCL 20 MG/ML IJ SOLN
5.0000 mg | Freq: Three times a day (TID) | INTRAMUSCULAR | Status: DC | PRN
Start: 2015-10-08 — End: 2015-10-11

## 2015-10-08 MED ORDER — SODIUM CHLORIDE 0.9 % IV SOLN
INTRAVENOUS | Status: DC
  Administered 2015-10-08 – 2015-10-09 (×3): via INTRAVENOUS

## 2015-10-08 MED ORDER — ASPIRIN EC 81 MG PO TBEC
81.0000 mg | DELAYED_RELEASE_TABLET | Freq: Every day | ORAL | Status: DC
  Administered 2015-10-08 – 2015-10-10 (×3): 81 mg via ORAL
  Filled 2015-10-08 (×3): qty 1

## 2015-10-08 MED ORDER — ASPIRIN 325 MG PO TABS: 325.0000 mg | ORAL_TABLET | Freq: Every day | ORAL | Status: DC

## 2015-10-08 NOTE — ED Provider Notes (Signed)
[facility]-EMERGENCY DEPT Provider Note   CSN: [id number] Arrival date & time: 10/08/15  1156  First Provider Contact:  First MD Initiated Contact with Patient 10/08/15 1225        History   Chief Complaint Chief Complaint  Patient presents with  . Fall    HPI Terry Francis is a 70 y.o. male.  Patient with a history of Stroke and Dementia presents today with a chief complaint of fall.  Wife reports that when she returned home after working a 12 hour shift she found him lying on the ground.  When she asked him if he had fallen, he stated "I guess so."  Patient was conscious when he was found by his wife.  Both wife and patient are unsure how long he had been on the ground.  Fall occurred sometime during the wife's 12 hour shift and was unwitnessed.  Patient declines any pain at this time, but wife reports that he has been holding unto his left wrist.  Level V Caveat applies due to dementia.  Wife also reports that he has right periorbital ecchymosis secondary to a fall that occurred four days ago.  He was not evaluated by a medical provider after that fall.  Wife reports that he has had issues with balance and several falls over the past couple of months.  Wife reports that the patient's PCP ordered an outpatient MRI brain 2 days ago due to frequent falls.  MRI reviewed, which showed acute and subacute infarcts.        Past Medical History:  Diagnosis Date  . Dementia   . Hyperlipidemia   . Hypertension   . RA (rheumatoid arthritis) (HCC)   . 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 Subclinical hypothyroidism 10/11/2013  . Testosterone deficiency 10/11/2013  . Other malaise and fatigue 08/22/2013  . Abnormal TSH 08/22/2013  . Dizziness and giddiness 08/18/2013  . Carotid bruit 08/18/2013  . Syncope and collapse 08/15/2013     History reviewed. No pertinent surgical history.     Home Medications    Prior to Admission medications   Medication Sig Start Date End Date Taking? Authorizing Provider  acetaminophen (TYLENOL) 325 MG tablet Take 650 mg by mouth every 6 (six) hours as needed.   Yes Historical Provider, MD  naproxen sodium (ANAPROX) 220 MG tablet Take 220 mg by mouth 2 (two) times daily as needed (pain). Reported on 09/15/2015   Yes Historical Provider, MD  atorvastatin (LIPITOR) 20 MG tablet Take 1 tablet (20 mg total) by mouth daily. Patient not taking: Reported on 09/14/2015 06/26/14   Donita Brooks, MD  clopidogrel (PLAVIX) 75 MG tablet Take 1 tablet (75 mg total) by mouth daily. Patient not taking: Reported on 09/14/2015 11/12/14   Donita Brooks, MD  donepezil (ARICEPT) 10 MG tablet Take 1 tablet (10 mg total) by mouth at bedtime. Patient not taking: Reported on 09/14/2015 06/11/14   Donita Brooks, MD  meclizine (ANTIVERT) 25 MG tablet TAKE 1 TABLET BY MOUTH 3 TIMES DAILY AS NEEDED FOR DIZZINESS. Patient not taking: Reported on 09/14/2015 12/04/13   Salley Scarlet, MD  Memantine HCl ER (NAMENDA XR TITRATION PACK) 7 & 14 & 21 &28 MG CP24 Follow instructions on box Patient not taking: Reported on 09/14/2015 12/23/14   Drema Dallas,  DO    Family History No family history on file.  Social History Social History  Substance Use Topics  . Smoking status: Current Every Day Smoker    Packs/day: 1.00    Years: 50.00    Types: Cigarettes  . Smokeless tobacco: Never Used  . Alcohol use Yes     Allergies   Review of patient's allergies indicates no known allergies.   Review of Systems Review of Systems  Unable to perform ROS: Dementia     Physical Exam Updated Vital Signs BP 154/88   Pulse 88   Temp 98.5 F (36.9 C) (Oral)   Resp 16   SpO2 99%   Physical Exam  Constitutional: He appears well-developed and well-nourished.  HENT:  Head: Normocephalic.  Mouth/Throat:  Oropharynx is clear and moist.  Right periorbital ecchymosis  Eyes: Pupils are equal, round, and reactive to light.  Neck: Normal range of motion. Neck supple.  Cardiovascular: Normal rate and regular rhythm.   Pulmonary/Chest: Effort normal and breath sounds normal.  Musculoskeletal:  Full ROM of lower extremities bilaterally Full ROM of RUE Tenderness to palpation and swelling of the left wrist.  Large skin tear and bruising to the left upper arm. No apparent pain with palpation of the thoracic or lumbar spine  Neurological: He is alert.  Patient unable to cooperate with a neurological exam.  Skin: Skin is warm and dry.  Psychiatric: He has a normal mood and affect.  Nursing note and vitals reviewed.    ED Treatments / Results  Labs (all labs ordered are listed, but only abnormal results are displayed) Labs Reviewed  CBC WITH DIFFERENTIAL/PLATELET - Abnormal; Notable for the following:       Result Value   Platelets 129 (*)    All other components within normal limits  COMPREHENSIVE METABOLIC PANEL  URINALYSIS, ROUTINE W REFLEX MICROSCOPIC (NOT AT [facility])  TROPONIN I  CK    EKG  EKG Interpretation None       Radiology Dg Wrist Complete Left  Result Date: 10/08/2015 CLINICAL DATA:  Status post fall today with lacerations and swelling about the left wrist. Initial encounter. EXAM: LEFT WRIST - COMPLETE 3+ VIEW COMPARISON:  None. FINDINGS: Soft tissues about the wrist appear swollen. No radiopaque foreign body is seen. No acute bony or joint abnormality is identified. Degenerative change of the radiocarpal joint, first CMC and scaphoid trapezium trapezoid joints is noted. IMPRESSION: Soft tissue swelling without underlying acute bony or joint abnormality. Electronically Signed   By: Drusilla Kanner M.D.   On: 10/08/2015 13:21   Ct Head Wo Contrast  Result Date: 10/08/2015 CLINICAL DATA:  Fall with head injury.  History of dementia. EXAM: CT HEAD WITHOUT CONTRAST CT  MAXILLOFACIAL WITHOUT CONTRAST CT CERVICAL SPINE WITHOUT CONTRAST TECHNIQUE: Multidetector CT imaging of the head, cervical spine, and maxillofacial structures were performed using the standard protocol without intravenous contrast. Multiplanar CT image reconstructions of the cervical spine and maxillofacial structures were also generated. COMPARISON:  CT of the head and cervical spine on 09/14/2015. FINDINGS: CT HEAD FINDINGS Stable advanced small vessel ischemic changes in the periventricular white matter and associated cortical atrophy. The brain demonstrates no evidence of hemorrhage, infarction, edema, mass effect, extra-axial fluid collection, hydrocephalus or mass lesion. The skull is unremarkable. New since the prior study is mucosal thickening within left-sided sinuses including frontal, ethmoid, maxillary and sphenoid sinuses. CT MAXILLOFACIAL FINDINGS No acute maxillofacial fracture is identified. As seen on the head CT, mucosal sinus disease present  with mucosal thickening within left-sided frontal, ethmoid, sphenoid and maxillary air cells. There is a retention cyst in the lower right maxillary antrum. Orbits have a normal appearance bilaterally. The temporomandibular joint show normal alignment. The nasal septum is mildly deviated to the left. CT CERVICAL SPINE FINDINGS The cervical spine shows normal alignment. There is no evidence of acute fracture or subluxation. No soft tissue swelling or hematoma is identified. Spondylosis throughout most of the cervical spine appears stable since the prior study. No bony or soft tissue lesions are seen. The visualized airway is normally patent. IMPRESSION: 1. No acute findings by head CT with stable advanced small vessel disease and atrophy. 2. No acute maxillofacial fracture identified. New mucosal sinus disease since the prior head CT affecting left-sided sinuses. 3. No evidence of acute cervical spine injury. Electronically Signed   By: Irish Lack M.D.    On: 10/08/2015 13:12   Ct Cervical Spine Wo Contrast  Result Date: 10/08/2015 CLINICAL DATA:  Fall with head injury.  History of dementia. EXAM: CT HEAD WITHOUT CONTRAST CT MAXILLOFACIAL WITHOUT CONTRAST CT CERVICAL SPINE WITHOUT CONTRAST TECHNIQUE: Multidetector CT imaging of the head, cervical spine, and maxillofacial structures were performed using the standard protocol without intravenous contrast. Multiplanar CT image reconstructions of the cervical spine and maxillofacial structures were also generated. COMPARISON:  CT of the head and cervical spine on 09/14/2015. FINDINGS: CT HEAD FINDINGS Stable advanced small vessel ischemic changes in the periventricular white matter and associated cortical atrophy. The brain demonstrates no evidence of hemorrhage, infarction, edema, mass effect, extra-axial fluid collection, hydrocephalus or mass lesion. The skull is unremarkable. New since the prior study is mucosal thickening within left-sided sinuses including frontal, ethmoid, maxillary and sphenoid sinuses. CT MAXILLOFACIAL FINDINGS No acute maxillofacial fracture is identified. As seen on the head CT, mucosal sinus disease present with mucosal thickening within left-sided frontal, ethmoid, sphenoid and maxillary air cells. There is a retention cyst in the lower right maxillary antrum. Orbits have a normal appearance bilaterally. The temporomandibular joint show normal alignment. The nasal septum is mildly deviated to the left. CT CERVICAL SPINE FINDINGS The cervical spine shows normal alignment. There is no evidence of acute fracture or subluxation. No soft tissue swelling or hematoma is identified. Spondylosis throughout most of the cervical spine appears stable since the prior study. No bony or soft tissue lesions are seen. The visualized airway is normally patent. IMPRESSION: 1. No acute findings by head CT with stable advanced small vessel disease and atrophy. 2. No acute maxillofacial fracture identified.  New mucosal sinus disease since the prior head CT affecting left-sided sinuses. 3. No evidence of acute cervical spine injury. Electronically Signed   By: Irish Lack M.D.   On: 10/08/2015 13:12   Dg Humerus Left  Result Date: 10/08/2015 CLINICAL DATA:  Left upper arm pain and laceration secondary to a fall today. EXAM: LEFT HUMERUS - 2+ VIEW COMPARISON:  None. FINDINGS: There is no evidence of fracture or other focal bone lesions. Soft tissues are unremarkable. IMPRESSION: Negative. Electronically Signed   By: Francene Boyers M.D.   On: 10/08/2015 13:28   Ct Maxillofacial Wo Contrast  Result Date: 10/08/2015 CLINICAL DATA:  Fall with head injury.  History of dementia. EXAM: CT HEAD WITHOUT CONTRAST CT MAXILLOFACIAL WITHOUT CONTRAST CT CERVICAL SPINE WITHOUT CONTRAST TECHNIQUE: Multidetector CT imaging of the head, cervical spine, and maxillofacial structures were performed using the standard protocol without intravenous contrast. Multiplanar CT image reconstructions of the cervical spine and maxillofacial  structures were also generated. COMPARISON:  CT of the head and cervical spine on 09/14/2015. FINDINGS: CT HEAD FINDINGS Stable advanced small vessel ischemic changes in the periventricular white matter and associated cortical atrophy. The brain demonstrates no evidence of hemorrhage, infarction, edema, mass effect, extra-axial fluid collection, hydrocephalus or mass lesion. The skull is unremarkable. New since the prior study is mucosal thickening within left-sided sinuses including frontal, ethmoid, maxillary and sphenoid sinuses. CT MAXILLOFACIAL FINDINGS No acute maxillofacial fracture is identified. As seen on the head CT, mucosal sinus disease present with mucosal thickening within left-sided frontal, ethmoid, sphenoid and maxillary air cells. There is a retention cyst in the lower right maxillary antrum. Orbits have a normal appearance bilaterally. The temporomandibular joint show normal alignment.  The nasal septum is mildly deviated to the left. CT CERVICAL SPINE FINDINGS The cervical spine shows normal alignment. There is no evidence of acute fracture or subluxation. No soft tissue swelling or hematoma is identified. Spondylosis throughout most of the cervical spine appears stable since the prior study. No bony or soft tissue lesions are seen. The visualized airway is normally patent. IMPRESSION: 1. No acute findings by head CT with stable advanced small vessel disease and atrophy. 2. No acute maxillofacial fracture identified. New mucosal sinus disease since the prior head CT affecting left-sided sinuses. 3. No evidence of acute cervical spine injury. Electronically Signed   By: Irish Lack M.D.   On: 10/08/2015 13:12    Procedures Procedures (including critical care time)  Medications Ordered in ED Medications - No data to display   Initial Impression / Assessment and Plan / ED Course  I have reviewed the triage vital signs and the nursing notes.  Pertinent labs & imaging results that were available during my care of the patient were reviewed by me and considered in my medical decision making (see chart for details).  Clinical Course    2:53 PM Discussed with Dr Hilda Blades with Neurology who agrees to consult on the patient.     Final Clinical Impressions(s) / ED Diagnoses   Final diagnoses:  Left upper arm pain   Patient with a history of frequent falls presents today after a fall.  Fall was unwitnessed and patient unable to provide further history regarding the fall.  Wife noticed him on the ground when she returned from working her 12 hour shift.  Wife reports that the patient's PCP ordered MRI brain 2 days ago due to frequent falls.  MRI reviewed, which showed acute and subacute infarcts.  CT head, neck, and maxillofacial are all negative for acute findings.  Xray wrist and humerus are also negative.  Neurology consulted regarding the acute and subacute infarcts and  patient admitted to [facility] for stroke work up and placement.  New Prescriptions New Prescriptions   No medications on file     Santiago Glad, PA-C 10/08/15 1614    Azalia Bilis, MD 10/09/15 1520

## 2015-10-08 NOTE — ED Notes (Signed)
Patient transported to CT 

## 2015-10-08 NOTE — Progress Notes (Signed)
Pt got admitted from ED for TIA, post fall at home, hx of previous stroke, pt alert to self and place, settled in bed with wife at bedside, admit doc paged for routine [place] orders, called back and said to page the on call person for Rozanna Box, Maren Reamer paged accordingly, yet to call back, will however continue to monitor. [place]-As[place], Earma Nicolaou Efe

## 2015-10-08 NOTE — ED Notes (Signed)
Wrapped skin tear on left upper arm with non adherent bandage and roller gauze for comfort/infection control. Pt tolerated well.

## 2015-10-08 NOTE — Consult Note (Signed)
Requesting Physician: Dr. Patria Mane    Chief Complaint: Falls and acute CVA's on MRI brain 10/06/2015  History obtained from:  wife  HPI:                                                                                                                                         KENTRAVIOUS LIPFORD is an 70 y.o. male with end stage dementia who is full ADL and 90% bed and chair bound. Per wife he will even fall with his walker.  He was on Aricept in the past but recently started refusing to take the medication. His wife works 3rd shift and has no help at home. She states the falling has progressively become worse to the point she cannot care for him. HE has several areas of ecchymosis and skin tears on his arms due to falling. MRI obtained 2 days prior shows bilateral small infarcts. Currently he is in bed with makeshift C-collar, right black eye and left arm in splint. He is unable to tell me the date or year and only able to follow simple commands.   Date last known well: Unable to determine Time last known well: Unable to determine tPA Given: No: out of window         6  Modified Rankin: Rankin Score=5    Past Medical History:  Diagnosis Date  . Dementia   . Hyperlipidemia   . Hypertension   . RA (rheumatoid arthritis) (HCC)   . 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 No pertinent surgical history.  No family history on file. Social History:  reports that he has been smoking Cigarettes.  He has a 50.00 pack-year smoking history. He has never used smokeless tobacco. He reports that he drinks alcohol. He reports that he does not use drugs.  Allergies: No Known Allergies  Medications:                                                                                                                           No current facility-administered medications for this encounter.    Current Outpatient Prescriptions  Medication Sig  Dispense Refill  . acetaminophen (TYLENOL) 325 MG tablet Take 650 mg by mouth every 6 (six) hours as needed.    . naproxen sodium (ANAPROX)  220 MG tablet Take 220 mg by mouth 2 (two) times daily as needed (pain). Reported on 09/15/2015    . atorvastatin (LIPITOR) 20 MG tablet Take 1 tablet (20 mg total) by mouth daily. (Patient not taking: Reported on 09/14/2015) 30 tablet 3  . clopidogrel (PLAVIX) 75 MG tablet Take 1 tablet (75 mg total) by mouth daily. (Patient not taking: Reported on 09/14/2015) 90 tablet 3  . donepezil (ARICEPT) 10 MG tablet Take 1 tablet (10 mg total) by mouth at bedtime. (Patient not taking: Reported on 09/14/2015) 30 tablet 5  . meclizine (ANTIVERT) 25 MG tablet TAKE 1 TABLET BY MOUTH 3 TIMES DAILY AS NEEDED FOR DIZZINESS. (Patient not taking: Reported on 09/14/2015) 30 tablet 1  . Memantine HCl ER (NAMENDA XR TITRATION PACK) 7 & 14 & 21 &28 MG CP24 Follow instructions on box (Patient not taking: Reported on 09/14/2015) 28 capsule 0     ROS:                                                                                                                                       History obtained from wife  General ROS: negative for - chills, fatigue, fever, night sweats, weight gain or weight loss Psychological ROS: positive for - memory difficulties Ophthalmic ROS: negative for - blurry vision, double vision, eye pain or loss of vision ENT ROS: negative for - epistaxis, nasal discharge, oral lesions, sore throat, tinnitus or vertigo Allergy and Immunology ROS: negative for - hives or itchy/watery eyes Hematological and Lymphatic ROS: negative for - bleeding problems, bruising or swollen lymph nodes Endocrine ROS: negative for - galactorrhea, hair pattern changes, polydipsia/polyuria or temperature intolerance Respiratory ROS: negative for - cough, hemoptysis, shortness of breath or wheezing Cardiovascular ROS: negative for - chest pain, dyspnea on exertion, edema or irregular  heartbeat Gastrointestinal ROS: negative for - abdominal pain, diarrhea, hematemesis, nausea/vomiting or stool incontinence Genito-Urinary ROS: negative for - dysuria, hematuria, incontinence or urinary frequency/urgency Musculoskeletal ROS: positive for - joint swelling and weakness Neurological ROS: as noted in HPI Dermatological ROS: negative for rash and skin lesion changes  Neurologic Examination:                                                                                                      Blood pressure 106/77, pulse 87, temperature 98.5 F (36.9 C), temperature source Oral, resp. rate 15, SpO2 99 %.  HEENT-  Normocephalic, no lesions, without obvious abnormality.  Normal external eye and  conjunctiva.  Normal TM's bilaterally.  Normal auditory canals and external ears. Normal external nose, mucus membranes and septum.  Normal pharynx. Cardiovascular- S1, S2 normal, pulses palpable throughout   Lungs- no tachypnea, retractions or cyanosis Abdomen- normal findings: bowel sounds normal Extremities- no edema Lymph-no adenopathy palpable Musculoskeletal-no joint tenderness, deformity or swelling Skin-multiple bruises and skin tears.   Neurological Examination Mental Status: Not alert or oriented. No dysarthria or aphasia but is not very verbal. Able to follow simple one step commands--better on the right than left Cranial Nerves: II: blinks to threat bilaterally, pupils equal, round, reactive to light and accommodation III,IV, VI: ptosis  Present  Right eye but also has a significant bruise on that eye, extra-ocular motions intact bilaterally V,VII: face symmetric, facial light touch sensation normal bilaterally VIII: hearing normal bilaterally IX,X: uvula rises symmetrically XI: bilateral shoulder shrug XII: midline tongue extension Motor: Right : Upper extremity   5/5    Left:     Upper extremity   4/5 in splint on forearm but no fracture per Xray  Lower extremity    5/5     Lower extremity   4/5 Tone and bulk:normal tone throughout; no atrophy noted Sensory: Pinprick and light touch intact throughout, bilaterally Deep Tendon Reflexes: 2+ and symmetric throughout UE and 1+ at KJ no AJ Plantars: Mute bilaterally Cerebellar: Unable to perform complex commands well no dysmetria noted with arm and leg movements Gait: not tested       Lab Results: Basic Metabolic Panel:  Recent Labs Lab 10/08/15 1330  NA 136  K 3.1*  CL 100*  CO2 28  GLUCOSE 91  BUN 7  CREATININE 0.81  CALCIUM 9.0    Liver Function Tests:  Recent Labs Lab 10/08/15 1330  AST 31  ALT 13*  ALKPHOS 81  BILITOT 1.2  PROT 6.8  ALBUMIN 3.3*   No results for input(s): LIPASE, AMYLASE in the last 168 hours. No results for input(s): AMMONIA in the last 168 hours.  CBC:  Recent Labs Lab 10/08/15 1330  WBC 7.5  NEUTROABS 5.6  HGB 14.9  HCT 44.3  MCV 87.0  PLT 129*    Cardiac Enzymes:  Recent Labs Lab 10/08/15 1330  CKTOTAL 718*  TROPONINI <0.03    Lipid Panel: No results for input(s): CHOL, TRIG, HDL, CHOLHDL, VLDL, LDLCALC in the last 168 hours.  CBG: No results for input(s): GLUCAP in the last 168 hours.  Microbiology: No results found for this or any previous visit.  Coagulation Studies: No results for input(s): LABPROT, INR in the last 72 hours.  Imaging: Dg Wrist Complete Left  Result Date: 10/08/2015 CLINICAL DATA:  Status post fall today with lacerations and swelling about the left wrist. Initial encounter. EXAM: LEFT WRIST - COMPLETE 3+ VIEW COMPARISON:  None. FINDINGS: Soft tissues about the wrist appear swollen. No radiopaque foreign body is seen. No acute bony or joint abnormality is identified. Degenerative change of the radiocarpal joint, first CMC and scaphoid trapezium trapezoid joints is noted. IMPRESSION: Soft tissue swelling without underlying acute bony or joint abnormality. Electronically Signed   By: Drusilla Kanner M.D.   On:  10/08/2015 13:21   Ct Head Wo Contrast  Result Date: 10/08/2015 CLINICAL DATA:  Fall with head injury.  History of dementia. EXAM: CT HEAD WITHOUT CONTRAST CT MAXILLOFACIAL WITHOUT CONTRAST CT CERVICAL SPINE WITHOUT CONTRAST TECHNIQUE: Multidetector CT imaging of the head, cervical spine, and maxillofacial structures were performed using the standard protocol without intravenous contrast. Multiplanar  CT image reconstructions of the cervical spine and maxillofacial structures were also generated. COMPARISON:  CT of the head and cervical spine on 09/14/2015. FINDINGS: CT HEAD FINDINGS Stable advanced small vessel ischemic changes in the periventricular white matter and associated cortical atrophy. The brain demonstrates no evidence of hemorrhage, infarction, edema, mass effect, extra-axial fluid collection, hydrocephalus or mass lesion. The skull is unremarkable. New since the prior study is mucosal thickening within left-sided sinuses including frontal, ethmoid, maxillary and sphenoid sinuses. CT MAXILLOFACIAL FINDINGS No acute maxillofacial fracture is identified. As seen on the head CT, mucosal sinus disease present with mucosal thickening within left-sided frontal, ethmoid, sphenoid and maxillary air cells. There is a retention cyst in the lower right maxillary antrum. Orbits have a normal appearance bilaterally. The temporomandibular joint show normal alignment. The nasal septum is mildly deviated to the left. CT CERVICAL SPINE FINDINGS The cervical spine shows normal alignment. There is no evidence of acute fracture or subluxation. No soft tissue swelling or hematoma is identified. Spondylosis throughout most of the cervical spine appears stable since the prior study. No bony or soft tissue lesions are seen. The visualized airway is normally patent. IMPRESSION: 1. No acute findings by head CT with stable advanced small vessel disease and atrophy. 2. No acute maxillofacial fracture identified. New mucosal  sinus disease since the prior head CT affecting left-sided sinuses. 3. No evidence of acute cervical spine injury. Electronically Signed   By: Irish Lack M.D.   On: 10/08/2015 13:12   Ct Cervical Spine Wo Contrast  Result Date: 10/08/2015 CLINICAL DATA:  Fall with head injury.  History of dementia. EXAM: CT HEAD WITHOUT CONTRAST CT MAXILLOFACIAL WITHOUT CONTRAST CT CERVICAL SPINE WITHOUT CONTRAST TECHNIQUE: Multidetector CT imaging of the head, cervical spine, and maxillofacial structures were performed using the standard protocol without intravenous contrast. Multiplanar CT image reconstructions of the cervical spine and maxillofacial structures were also generated. COMPARISON:  CT of the head and cervical spine on 09/14/2015. FINDINGS: CT HEAD FINDINGS Stable advanced small vessel ischemic changes in the periventricular white matter and associated cortical atrophy. The brain demonstrates no evidence of hemorrhage, infarction, edema, mass effect, extra-axial fluid collection, hydrocephalus or mass lesion. The skull is unremarkable. New since the prior study is mucosal thickening within left-sided sinuses including frontal, ethmoid, maxillary and sphenoid sinuses. CT MAXILLOFACIAL FINDINGS No acute maxillofacial fracture is identified. As seen on the head CT, mucosal sinus disease present with mucosal thickening within left-sided frontal, ethmoid, sphenoid and maxillary air cells. There is a retention cyst in the lower right maxillary antrum. Orbits have a normal appearance bilaterally. The temporomandibular joint show normal alignment. The nasal septum is mildly deviated to the left. CT CERVICAL SPINE FINDINGS The cervical spine shows normal alignment. There is no evidence of acute fracture or subluxation. No soft tissue swelling or hematoma is identified. Spondylosis throughout most of the cervical spine appears stable since the prior study. No bony or soft tissue lesions are seen. The visualized airway is  normally patent. IMPRESSION: 1. No acute findings by head CT with stable advanced small vessel disease and atrophy. 2. No acute maxillofacial fracture identified. New mucosal sinus disease since the prior head CT affecting left-sided sinuses. 3. No evidence of acute cervical spine injury. Electronically Signed   By: Irish Lack M.D.   On: 10/08/2015 13:12   Dg Humerus Left  Result Date: 10/08/2015 CLINICAL DATA:  Left upper arm pain and laceration secondary to a fall today. EXAM: LEFT HUMERUS - 2+  VIEW COMPARISON:  None. FINDINGS: There is no evidence of fracture or other focal bone lesions. Soft tissues are unremarkable. IMPRESSION: Negative. Electronically Signed   By: Francene Boyers M.D.   On: 10/08/2015 13:28   Ct Maxillofacial Wo Contrast  Result Date: 10/08/2015 CLINICAL DATA:  Fall with head injury.  History of dementia. EXAM: CT HEAD WITHOUT CONTRAST CT MAXILLOFACIAL WITHOUT CONTRAST CT CERVICAL SPINE WITHOUT CONTRAST TECHNIQUE: Multidetector CT imaging of the head, cervical spine, and maxillofacial structures were performed using the standard protocol without intravenous contrast. Multiplanar CT image reconstructions of the cervical spine and maxillofacial structures were also generated. COMPARISON:  CT of the head and cervical spine on 09/14/2015. FINDINGS: CT HEAD FINDINGS Stable advanced small vessel ischemic changes in the periventricular white matter and associated cortical atrophy. The brain demonstrates no evidence of hemorrhage, infarction, edema, mass effect, extra-axial fluid collection, hydrocephalus or mass lesion. The skull is unremarkable. New since the prior study is mucosal thickening within left-sided sinuses including frontal, ethmoid, maxillary and sphenoid sinuses. CT MAXILLOFACIAL FINDINGS No acute maxillofacial fracture is identified. As seen on the head CT, mucosal sinus disease present with mucosal thickening within left-sided frontal, ethmoid, sphenoid and maxillary air  cells. There is a retention cyst in the lower right maxillary antrum. Orbits have a normal appearance bilaterally. The temporomandibular joint show normal alignment. The nasal septum is mildly deviated to the left. CT CERVICAL SPINE FINDINGS The cervical spine shows normal alignment. There is no evidence of acute fracture or subluxation. No soft tissue swelling or hematoma is identified. Spondylosis throughout most of the cervical spine appears stable since the prior study. No bony or soft tissue lesions are seen. The visualized airway is normally patent. IMPRESSION: 1. No acute findings by head CT with stable advanced small vessel disease and atrophy. 2. No acute maxillofacial fracture identified. New mucosal sinus disease since the prior head CT affecting left-sided sinuses. 3. No evidence of acute cervical spine injury. Electronically Signed   By: Irish Lack M.D.   On: 10/08/2015 13:12          Assessment and plan discussed with with attending physician and they are in agreement.    Felicie Morn PA-C [facility] [phone]  10/08/2015, 3:29 PM   Assessment: 70 y.o. male with end stage dementia and MR of 4 (needing complete help with ADLS) who presented to ED after having increasing falls and MRI brain showing bilateral likely of embolic source. At this time he is a significant fall risk (not a AC candidate) and with his dementia he would not be a CEA candidate.   Stroke Risk Factors - hyperlipidemia and hypertension   Recommend: 1) PT/OT/SLP 2) A1c and LDL 3) ASA 81 mg daily   Neurology attending:  Shariq is a 70 year old gentleman with advanced dementia. He was brought to the emergency room after a fall. He has been directly evaluated by Felicie Morn as well as myself. He has no obvious focal neurological deficits. The exam is difficult due to advanced dementia.  He had undergone an MRI as an outpatient on 10/06/2015.  His neuro imaging reveals the presence of several  small bilateral acute as well as subacute infarcts. He is not a candidate for carotid endarterectomy or anticoagulation with Coumadin. As such further evaluations for these risk factors is not indicated.  Plan:  1. Plans as per Charyl Bigger notes.  2. Will follow.   Rose Fillers, M.D. [facility]ist

## 2015-10-08 NOTE — ED Triage Notes (Signed)
Per ems- Pt coming from home after a fall today, his caregiver found him on the floor guarding his left wrist. EMS splinted his wrist and he has a skin tear to his left bicep. Pt has hx of dementia, is alert to baseline. Is unsure what happened. Towel placed around neck for immobilization. BP 140/70.

## 2015-10-08 NOTE — H&P (Signed)
History and Physical    Terry Francis NWG:[id number] DOB: 01-30-1946 DOA: 10/08/2015   PCP: Leo Grosser, MD   Patient coming from:  Home   Chief Complaint: Falls   HPI: Terry Francis is a 70 y.o. male with medical history significant for  Alzheimer's dementia, HTN, HLD, prior R cerebellar CVA, RA, presenting to the ED after sustaining a fall. The wife reports that when she returned home after working in 12 hours shift, she found him lying on the ground. He was awake, but it is unclear how long as he been on the ground.when found, he was holding his left wrist. He had significant left arm skin tear, with some bruising on the right forearm, which was immobilized. History is obtained by wife, the patient cannot provide due to dementia. The patient had sustained another fall 2 days prior hitting his right orbital area without a fracture. As he was experiencing frequent falls over the last 3-4 months, his PCP ordered MRI head as OP which was read today, showing subacute to acute infarct. Neuro consult has been obtained (Dr. Hilda Blades). Because of his overall clinical decline, and wife unable to care for him, will admit anticipating that he will need long term placement for care. He is DNR/DNI. Wife denies any other issues such as respiratory or cardiac complaints. No appetite changes, no dysphagia reported. No bleeding issues reported. No seizures. No history of cancer.   ED Course:  BP 163/93   Pulse 87   Temp 98.5 F (36.9 C) (Oral)   Resp (!) 27   SpO2 99%    EKG without ACS. Sodium 136 potassium 3.1 creatinine 0.81 AST 31 ALT 13, Bili 1.2   CK 718 Hb 14.9  UA neg for leuko or nitrites CT Cspine, head, maxillofacial negative for fracture.  DG L humerus, R wrist neg for fracture MRI brain Multiple areas of acute and subacute infarction Glu 91 Out of window for tPA  Review of Systems:  Unable to obtain due to dementia  Past Medical History:  Diagnosis Date  . Dementia   .  Hyperlipidemia   . Hypertension   . RA (rheumatoid arthritis) (HCC)   . Stroke [facility])    chronic microvascular damage in basal ganglia and CVA in R cerebellum    History reviewed. No pertinent surgical history.  Social History Social History   Social History  . Marital status: Married    Spouse name: N/A  . Number of children: N/A  . Years of education: N/A   Occupational History  . Not on file.   Social History Main Topics  . Smoking status: Current Every Day Smoker    Packs/day: 1.00    Years: 50.00    Types: Cigarettes  . Smokeless tobacco: Never Used  . Alcohol use Yes  . Drug use: No  . Sexual activity: Yes   Other Topics Concern  . Not on file   Social History Narrative   Pt lives with his wife, [name], in a one story home. He completed the 7th grade.      No Known Allergies  No family history on file.    Prior to Admission medications   Medication Sig Start Date End Date Taking? Authorizing Provider  acetaminophen (TYLENOL) 325 MG tablet Take 650 mg by mouth every 6 (six) hours as needed.   Yes Historical Provider, MD  naproxen sodium (ANAPROX) 220 MG tablet Take 220 mg by mouth 2 (two) times daily as needed (pain). Reported on  09/15/2015   Yes Historical Provider, MD  atorvastatin (LIPITOR) 20 MG tablet Take 1 tablet (20 mg total) by mouth daily. Patient not taking: Reported on 09/14/2015 06/26/14   Donita Brooks, MD  clopidogrel (PLAVIX) 75 MG tablet Take 1 tablet (75 mg total) by mouth daily. Patient not taking: Reported on 09/14/2015 11/12/14   Donita Brooks, MD  donepezil (ARICEPT) 10 MG tablet Take 1 tablet (10 mg total) by mouth at bedtime. Patient not taking: Reported on 09/14/2015 06/11/14   Donita Brooks, MD  meclizine (ANTIVERT) 25 MG tablet TAKE 1 TABLET BY MOUTH 3 TIMES DAILY AS NEEDED FOR DIZZINESS. Patient not taking: Reported on 09/14/2015 12/04/13   Salley Scarlet, MD  Memantine HCl ER (NAMENDA XR TITRATION PACK) 7 & 14 & 21 &28 MG CP24  Follow instructions on box Patient not taking: Reported on 09/14/2015 12/23/14   Drema Dallas, DO    Physical Exam:    Vitals:   10/08/15 1345 10/08/15 1400 10/08/15 1430 10/08/15 1530  BP: 154/88 153/77 106/77 163/93  Pulse: 88 86 87   Resp: 16 18 15  (!) 27  Temp:      TempSrc:      SpO2: 99% 97% 99%        Constitutional: NAD, calm, comfortable, unable to engage in conversation in the setting of dementia Vitals:   10/08/15 1345 10/08/15 1400 10/08/15 1430 10/08/15 1530  BP: 154/88 153/77 106/77 163/93  Pulse: 88 86 87   Resp: 16 18 15  (!) 27  Temp:      TempSrc:      SpO2: 99% 97% 99%    Eyes: PERRL,  eyelid with periorbital bruising and edema post fall  ENMT: Mucous membranes are moist. Posterior pharynx clear of any exudate or lesions.Normal dentition.  Neck: normal, supple, no masses, no thyromegaly Respiratory: clear to auscultation bilaterally, no wheezing, no crackles. Normal respiratory effort. No accessory muscle use.  Cardiovascular: Regular rate and rhythm, no murmurs / rubs / gallops. No extremity edema. 2+ pedal pulses. No carotid bruits.  Abdomen: no tenderness, no masses palpated. No hepatosplenomegaly. Bowel sounds positive.  Musculoskeletal: no clubbing / cyanosis. No joint deformity upper and lower extremities. Good ROM, no contractures. Normal muscle tone.  Skin: Left bicepcs skin tear, several areas of echymmoses, Neurologic Dementia. Very simple commands , strength and sensation appear adequate. No dysarthria noted. Unable to test gait. Mildly anxious mood.     Labs on Admission: I have personally reviewed following labs and imaging studies  CBC:  Recent Labs Lab 10/08/15 1330  WBC 7.5  NEUTROABS 5.6  HGB 14.9  HCT 44.3  MCV 87.0  PLT 129*    Basic Metabolic Panel:  Recent Labs Lab 10/08/15 1330  NA 136  K 3.1*  CL 100*  CO2 28  GLUCOSE 91  BUN 7  CREATININE 0.81  CALCIUM 9.0    GFR: CrCl cannot be calculated (Unknown  ideal weight.).  Liver Function Tests:  Recent Labs Lab 10/08/15 1330  AST 31  ALT 13*  ALKPHOS 81  BILITOT 1.2  PROT 6.8  ALBUMIN 3.3*   No results for input(s): LIPASE, AMYLASE in the last 168 hours. No results for input(s): AMMONIA in the last 168 hours.  Coagulation Profile: No results for input(s): INR, PROTIME in the last 168 hours.  Cardiac Enzymes:  Recent Labs Lab 10/08/15 1330  CKTOTAL 718*  TROPONINI <0.03    BNP (last 3 results) No results for input(s): PROBNP in  the last 8760 hours.  HbA1C: No results for input(s): HGBA1C in the last 72 hours.  CBG: No results for input(s): GLUCAP in the last 168 hours.  Lipid Profile: No results for input(s): CHOL, HDL, LDLCALC, TRIG, CHOLHDL, LDLDIRECT in the last 72 hours.  Thyroid Function Tests: No results for input(s): TSH, T4TOTAL, FREET4, T3FREE, THYROIDAB in the last 72 hours.  Anemia Panel: No results for input(s): VITAMINB12, FOLATE, FERRITIN, TIBC, IRON, RETICCTPCT in the last 72 hours.  Urine analysis:    Component Value Date/Time   COLORURINE AMBER (A) 10/08/2015 1350   APPEARANCEUR CLEAR 10/08/2015 1350   LABSPEC 1.023 10/08/2015 1350   PHURINE 7.0 10/08/2015 1350   GLUCOSEU NEGATIVE 10/08/2015 1350   HGBUR NEGATIVE 10/08/2015 1350   BILIRUBINUR SMALL (A) 10/08/2015 1350   KETONESUR NEGATIVE 10/08/2015 1350   PROTEINUR NEGATIVE 10/08/2015 1350   NITRITE NEGATIVE 10/08/2015 1350   LEUKOCYTESUR NEGATIVE 10/08/2015 1350    Sepsis Labs: (procalcitonin:4,lacticidven:4) )No results found for this or any previous visit (from the past 240 hour(s)).   Radiological Exams on Admission: Dg Wrist Complete Left  Result Date: 10/08/2015 CLINICAL DATA:  Status post fall today with lacerations and swelling about the left wrist. Initial encounter. EXAM: LEFT WRIST - COMPLETE 3+ VIEW COMPARISON:  None. FINDINGS: Soft tissues about the wrist appear swollen. No radiopaque foreign body is seen. No  acute bony or joint abnormality is identified. Degenerative change of the radiocarpal joint, first CMC and scaphoid trapezium trapezoid joints is noted. IMPRESSION: Soft tissue swelling without underlying acute bony or joint abnormality. Electronically Signed   By: Drusilla Kanner M.D.   On: 10/08/2015 13:21   Ct Head Wo Contrast  Result Date: 10/08/2015 CLINICAL DATA:  Fall with head injury.  History of dementia. EXAM: CT HEAD WITHOUT CONTRAST CT MAXILLOFACIAL WITHOUT CONTRAST CT CERVICAL SPINE WITHOUT CONTRAST TECHNIQUE: Multidetector CT imaging of the head, cervical spine, and maxillofacial structures were performed using the standard protocol without intravenous contrast. Multiplanar CT image reconstructions of the cervical spine and maxillofacial structures were also generated. COMPARISON:  CT of the head and cervical spine on 09/14/2015. FINDINGS: CT HEAD FINDINGS Stable advanced small vessel ischemic changes in the periventricular white matter and associated cortical atrophy. The brain demonstrates no evidence of hemorrhage, infarction, edema, mass effect, extra-axial fluid collection, hydrocephalus or mass lesion. The skull is unremarkable. New since the prior study is mucosal thickening within left-sided sinuses including frontal, ethmoid, maxillary and sphenoid sinuses. CT MAXILLOFACIAL FINDINGS No acute maxillofacial fracture is identified. As seen on the head CT, mucosal sinus disease present with mucosal thickening within left-sided frontal, ethmoid, sphenoid and maxillary air cells. There is a retention cyst in the lower right maxillary antrum. Orbits have a normal appearance bilaterally. The temporomandibular joint show normal alignment. The nasal septum is mildly deviated to the left. CT CERVICAL SPINE FINDINGS The cervical spine shows normal alignment. There is no evidence of acute fracture or subluxation. No soft tissue swelling or hematoma is identified. Spondylosis throughout most of the  cervical spine appears stable since the prior study. No bony or soft tissue lesions are seen. The visualized airway is normally patent. IMPRESSION: 1. No acute findings by head CT with stable advanced small vessel disease and atrophy. 2. No acute maxillofacial fracture identified. New mucosal sinus disease since the prior head CT affecting left-sided sinuses. 3. No evidence of acute cervical spine injury. Electronically Signed   By: Irish Lack M.D.   On: 10/08/2015 13:12   Ct  Cervical Spine Wo Contrast  Result Date: 10/08/2015 CLINICAL DATA:  Fall with head injury.  History of dementia. EXAM: CT HEAD WITHOUT CONTRAST CT MAXILLOFACIAL WITHOUT CONTRAST CT CERVICAL SPINE WITHOUT CONTRAST TECHNIQUE: Multidetector CT imaging of the head, cervical spine, and maxillofacial structures were performed using the standard protocol without intravenous contrast. Multiplanar CT image reconstructions of the cervical spine and maxillofacial structures were also generated. COMPARISON:  CT of the head and cervical spine on 09/14/2015. FINDINGS: CT HEAD FINDINGS Stable advanced small vessel ischemic changes in the periventricular white matter and associated cortical atrophy. The brain demonstrates no evidence of hemorrhage, infarction, edema, mass effect, extra-axial fluid collection, hydrocephalus or mass lesion. The skull is unremarkable. New since the prior study is mucosal thickening within left-sided sinuses including frontal, ethmoid, maxillary and sphenoid sinuses. CT MAXILLOFACIAL FINDINGS No acute maxillofacial fracture is identified. As seen on the head CT, mucosal sinus disease present with mucosal thickening within left-sided frontal, ethmoid, sphenoid and maxillary air cells. There is a retention cyst in the lower right maxillary antrum. Orbits have a normal appearance bilaterally. The temporomandibular joint show normal alignment. The nasal septum is mildly deviated to the left. CT CERVICAL SPINE FINDINGS The  cervical spine shows normal alignment. There is no evidence of acute fracture or subluxation. No soft tissue swelling or hematoma is identified. Spondylosis throughout most of the cervical spine appears stable since the prior study. No bony or soft tissue lesions are seen. The visualized airway is normally patent. IMPRESSION: 1. No acute findings by head CT with stable advanced small vessel disease and atrophy. 2. No acute maxillofacial fracture identified. New mucosal sinus disease since the prior head CT affecting left-sided sinuses. 3. No evidence of acute cervical spine injury. Electronically Signed   By: Irish Lack M.D.   On: 10/08/2015 13:12   Dg Humerus Left  Result Date: 10/08/2015 CLINICAL DATA:  Left upper arm pain and laceration secondary to a fall today. EXAM: LEFT HUMERUS - 2+ VIEW COMPARISON:  None. FINDINGS: There is no evidence of fracture or other focal bone lesions. Soft tissues are unremarkable. IMPRESSION: Negative. Electronically Signed   By: Francene Boyers M.D.   On: 10/08/2015 13:28   Ct Maxillofacial Wo Contrast  Result Date: 10/08/2015 CLINICAL DATA:  Fall with head injury.  History of dementia. EXAM: CT HEAD WITHOUT CONTRAST CT MAXILLOFACIAL WITHOUT CONTRAST CT CERVICAL SPINE WITHOUT CONTRAST TECHNIQUE: Multidetector CT imaging of the head, cervical spine, and maxillofacial structures were performed using the standard protocol without intravenous contrast. Multiplanar CT image reconstructions of the cervical spine and maxillofacial structures were also generated. COMPARISON:  CT of the head and cervical spine on 09/14/2015. FINDINGS: CT HEAD FINDINGS Stable advanced small vessel ischemic changes in the periventricular white matter and associated cortical atrophy. The brain demonstrates no evidence of hemorrhage, infarction, edema, mass effect, extra-axial fluid collection, hydrocephalus or mass lesion. The skull is unremarkable. New since the prior study is mucosal thickening  within left-sided sinuses including frontal, ethmoid, maxillary and sphenoid sinuses. CT MAXILLOFACIAL FINDINGS No acute maxillofacial fracture is identified. As seen on the head CT, mucosal sinus disease present with mucosal thickening within left-sided frontal, ethmoid, sphenoid and maxillary air cells. There is a retention cyst in the lower right maxillary antrum. Orbits have a normal appearance bilaterally. The temporomandibular joint show normal alignment. The nasal septum is mildly deviated to the left. CT CERVICAL SPINE FINDINGS The cervical spine shows normal alignment. There is no evidence of acute fracture or subluxation. No  soft tissue swelling or hematoma is identified. Spondylosis throughout most of the cervical spine appears stable since the prior study. No bony or soft tissue lesions are seen. The visualized airway is normally patent. IMPRESSION: 1. No acute findings by head CT with stable advanced small vessel disease and atrophy. 2. No acute maxillofacial fracture identified. New mucosal sinus disease since the prior head CT affecting left-sided sinuses. 3. No evidence of acute cervical spine injury. Electronically Signed   By: Irish Lack M.D.   On: 10/08/2015 13:12    EKG: Independently reviewed.  Assessment/Plan Active Problems:   Syncope and collapse   Subclinical hypothyroidism   Mixed Alzheimer's and vascular dementia   Frequent falls   Hyperbilirubinemia   Hypertension   Hyperlipidemia   CVA (cerebral infarction)   History of stroke/ Subacute to Acute CVA per MRI brain 8/7, likely embolic with possible syncopal episode . Admitted due to frequent f with trauma to the upper extremity.alls. Not a TPA candidate; found on floor, unknown how long he had been there, may be  Up to 12 hrs. 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 Neuro involved. Discussed with wife who does not wish further studies such as echo, ultrasounds, or labs such as lipid panel or A1C,  as patient has significant clinical decline and she is interested in Palliative Care consultation for long term placement  and  For goals of care . She wishes to have performed only testing and administer meds that could hep any reversibility of symptoms - Admit to medsurg obs Stroke order set Allow permissive HTN Nursing bedside swallow eval -Aspirin  Air overlay mattress Repeat CK tonight and tomorrow IV fluids Social Work and PAlliative Care consult for above   Hyperbilirubinemia, in the setting of  Dehydration,  T bil 1.2. AST normal, ALT 13    No jaundice noted.  Hold tylenol  IVF  Repeat CMET in am   Hypertension BP 163/93   Pulse 87  Allow permissive hypertension Add Hydralazine Q6 hours as needed for BP 210/210  Hypothyroidism: Not on meds, will not resume as per wife request    Deconditioned with falls risk -  Fall precautions -  PT/OT  Alzheimer's dementia Not on meds as per wife request   Hyperlipidemia Not on meds as per wife request   DVT prophylaxis: SCDs Code Status:    DNR Family Communication:  Discussed with patient wife Disposition Plan: Expect patient to be discharged to long term care facility  Consults called:  Neuro Dr. Hilda Blades; Consult to Social Work and to Palliative Care  Admission status: Med Surg Obs   Marcos Eke, PA-C [facility]   If 7PM-7AM, please contact night-coverage [email or web address] Password [email or web address]  10/08/2015, 4:03 PM

## 2015-10-08 NOTE — ED Notes (Signed)
Pt from home with wife for an unwitnessed fall. Pt has hx of dementia. Wife found pt lying in the floor when she arrived home. Pt can move all extremities without difficulty. Skin tear present to L upper arm. EMS has splint to L wrist (xray negative for any fractures). Pt follow commands at times, is A&O x 2 (self, place). Admission for CVA which was present on a MRI from two days ago per admission H&P. Pt passed swallow screen--Davionte Lusby RN [phone]

## 2015-10-09 DIAGNOSIS — Z9181 History of falling: Secondary | ICD-10-CM | POA: Diagnosis not present

## 2015-10-09 DIAGNOSIS — E039 Hypothyroidism, unspecified: Secondary | ICD-10-CM | POA: Diagnosis present

## 2015-10-09 DIAGNOSIS — F0281 Dementia in other diseases classified elsewhere with behavioral disturbance: Secondary | ICD-10-CM | POA: Diagnosis present

## 2015-10-09 DIAGNOSIS — I6309 Cerebral infarction due to thrombosis of other precerebral artery: Secondary | ICD-10-CM

## 2015-10-09 DIAGNOSIS — R17 Unspecified jaundice: Secondary | ICD-10-CM | POA: Diagnosis present

## 2015-10-09 DIAGNOSIS — I639 Cerebral infarction, unspecified: Secondary | ICD-10-CM | POA: Diagnosis present

## 2015-10-09 DIAGNOSIS — S41112A Laceration without foreign body of left upper arm, initial encounter: Secondary | ICD-10-CM | POA: Diagnosis present

## 2015-10-09 DIAGNOSIS — S0011XA Contusion of right eyelid and periocular area, initial encounter: Secondary | ICD-10-CM | POA: Diagnosis present

## 2015-10-09 DIAGNOSIS — Z66 Do not resuscitate: Secondary | ICD-10-CM | POA: Diagnosis present

## 2015-10-09 DIAGNOSIS — E785 Hyperlipidemia, unspecified: Secondary | ICD-10-CM | POA: Diagnosis present

## 2015-10-09 DIAGNOSIS — Z515 Encounter for palliative care: Secondary | ICD-10-CM

## 2015-10-09 DIAGNOSIS — Z79899 Other long term (current) drug therapy: Secondary | ICD-10-CM | POA: Diagnosis not present

## 2015-10-09 DIAGNOSIS — Z7902 Long term (current) use of antithrombotics/antiplatelets: Secondary | ICD-10-CM | POA: Diagnosis not present

## 2015-10-09 DIAGNOSIS — G309 Alzheimer's disease, unspecified: Secondary | ICD-10-CM | POA: Diagnosis present

## 2015-10-09 DIAGNOSIS — F17210 Nicotine dependence, cigarettes, uncomplicated: Secondary | ICD-10-CM | POA: Diagnosis present

## 2015-10-09 DIAGNOSIS — M79622 Pain in left upper arm: Secondary | ICD-10-CM | POA: Diagnosis present

## 2015-10-09 DIAGNOSIS — F0151 Vascular dementia with behavioral disturbance: Secondary | ICD-10-CM | POA: Diagnosis present

## 2015-10-09 DIAGNOSIS — Z8673 Personal history of transient ischemic attack (TIA), and cerebral infarction without residual deficits: Secondary | ICD-10-CM | POA: Diagnosis not present

## 2015-10-09 DIAGNOSIS — E038 Other specified hypothyroidism: Secondary | ICD-10-CM

## 2015-10-09 DIAGNOSIS — Z9114 Patient's other noncompliance with medication regimen: Secondary | ICD-10-CM | POA: Diagnosis not present

## 2015-10-09 DIAGNOSIS — R296 Repeated falls: Secondary | ICD-10-CM | POA: Diagnosis present

## 2015-10-09 DIAGNOSIS — F0150 Vascular dementia without behavioral disturbance: Secondary | ICD-10-CM | POA: Diagnosis not present

## 2015-10-09 DIAGNOSIS — R627 Adult failure to thrive: Secondary | ICD-10-CM | POA: Diagnosis present

## 2015-10-09 DIAGNOSIS — I10 Essential (primary) hypertension: Secondary | ICD-10-CM | POA: Diagnosis present

## 2015-10-09 DIAGNOSIS — M069 Rheumatoid arthritis, unspecified: Secondary | ICD-10-CM | POA: Diagnosis present

## 2015-10-09 DIAGNOSIS — E876 Hypokalemia: Secondary | ICD-10-CM | POA: Diagnosis present

## 2015-10-09 DIAGNOSIS — E860 Dehydration: Secondary | ICD-10-CM | POA: Diagnosis present

## 2015-10-09 DIAGNOSIS — W19XXXA Unspecified fall, initial encounter: Secondary | ICD-10-CM | POA: Diagnosis present

## 2015-10-09 LAB — CK: Total CK: 367 U/L (ref 49–397)

## 2015-10-09 LAB — MAGNESIUM: Magnesium: 1.9 mg/dL (ref 1.7–2.4)

## 2015-10-09 MED ORDER — MECLIZINE HCL 12.5 MG PO TABS: 25.0000 mg | ORAL_TABLET | Freq: Three times a day (TID) | ORAL | Status: DC | PRN

## 2015-10-09 MED ORDER — ATORVASTATIN CALCIUM 10 MG PO TABS: 20.0000 mg | ORAL_TABLET | Freq: Every day | ORAL | Status: DC

## 2015-10-09 MED ORDER — ATORVASTATIN CALCIUM 10 MG PO TABS
20.0000 mg | ORAL_TABLET | Freq: Every day | ORAL | Status: DC
  Administered 2015-10-09 – 2015-10-11 (×3): 20 mg via ORAL
  Filled 2015-10-09 (×3): qty 2

## 2015-10-09 MED ORDER — NAPROXEN SODIUM 220 MG PO TABS: 220.0000 mg | ORAL_TABLET | Freq: Two times a day (BID) | ORAL | Status: DC | PRN

## 2015-10-09 MED ORDER — MEMANTINE HCL ER 7 MG PO CP24
7.0000 mg | ORAL_CAPSULE | Freq: Every day | ORAL | Status: DC
  Administered 2015-10-09 – 2015-10-11 (×3): 7 mg via ORAL
  Filled 2015-10-09 (×3): qty 1

## 2015-10-09 MED ORDER — CLOPIDOGREL BISULFATE 75 MG PO TABS
75.0000 mg | ORAL_TABLET | Freq: Every day | ORAL | Status: DC
  Administered 2015-10-09: 75 mg via ORAL
  Filled 2015-10-09: qty 1

## 2015-10-09 MED ORDER — NAPROXEN 250 MG PO TABS
250.0000 mg | ORAL_TABLET | Freq: Two times a day (BID) | ORAL | Status: DC | PRN
  Filled 2015-10-09: qty 1

## 2015-10-09 MED ORDER — DONEPEZIL HCL 10 MG PO TABS
10.0000 mg | ORAL_TABLET | Freq: Every day | ORAL | Status: DC
  Administered 2015-10-09 – 2015-10-10 (×2): 10 mg via ORAL
  Filled 2015-10-09 (×2): qty 1

## 2015-10-09 NOTE — Progress Notes (Signed)
PROGRESS NOTE    Terry Francis  FBP:[id number] DOB: Jan 10, 1946 DOA: 10/08/2015 PCP: Leo Grosser, MD   Brief Narrative:  Terry Francis is a 70 y.o. male with a Past Medical History of dementia, HLD, RA, htn, CVA, who presents with fall secondary to progressive weakness and new strokes as seen on MRI. Patient had a very unfortunate condition given his chronic medical conditions. It appears that he has been well taken care of by his wife but she is on able to care for him in the future. Patient's quality of life seems to be deteriorating quickly. Family amenable to palliative care consult for goals of care discussions. Suspect if family were amenable patient may qualify for hospice or at least no further admissions to the hospital given the likelihood that he would succumb to a common location of his chronic conditions such as pneumonia, aspiration, UTI and potential eventual sepsis. Family not wanting aggressive measures or workup at this time.    Assessment & Plan:   Principal Problem:   CVA (cerebral infarction) Active Problems:   Syncope and collapse   Subclinical hypothyroidism   Mixed Alzheimer's and vascular dementia   Frequent falls   Hyperbilirubinemia   Hypertension   Hyperlipidemia   #1 acute CVA Patient presenting with acute on chronic neurological decline with increasing falls with a prior history of dementia. 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 Patient noted to be noncompliant with his medications was posted on aspirin and Plavix prior to admission. Patient also with a history of dementia and significant fall risk and as such not a anticoagulation candidate. Patient also noted to not be a CEA candidate. Patient has been assessed by neurology who recommended PT/OT/speech therapy. Will check a Fasting lipid panel and a hemoglobin A1c. Family does not want any aggressive measures at this  time. Continue aspirin for secondary stroke prevention. Risk factor modification. Follow. Palliative care consultation pending.  #2 Alzheimer's dementia Patient's posterior be on Namenda and Aricept however has been noncompliant with his medication. Will start patient back on Namenda and Aricept.  #3 hypertension Permissive hypertension secondary to problem #1. Hydralazine when necessary systolic blood pressure greater than 210.  #4 hypothyroidism Admitting physician wife had requested medication not to be resumed.  #5 hyperlipidemia Check a fasting lipid panel.   #6 deconditioning/increased fall risk/ftt Patient has been declining with increased falls and deconditioning and poor oral intake. Continue current diet. Continue hydration with IV fluids. Follow.   DVT prophylaxis: SCDs Code Status: DO NOT RESUSCITATE Family Communication: Updated patient and wife at bedside. Disposition Plan: Pending palliative care evaluation for goals of care and PT evaluation.   Consultants:   Neurology: Dr. Hilda Blades 10/08/2015  Palliative care pending  Procedures:   Plain films of the left humerus 10/08/2015  Plain films of the left wrist 10/08/2015  Antimicrobials:   None   Subjective: Patient denies any chest pain. No shortness of breath.  Objective: Vitals:   10/09/15 0001 10/09/15 0116 10/09/15 0504 10/09/15 0901  BP: (!) 129/49 130/62 (!) 147/83 (!) 149/61  Pulse: 86 71 74 81  Resp: 20 18 18 20   Temp:  97.3 F (36.3 C) 97.3 F (36.3 C) 97.5 F (36.4 C)  TempSrc:  Axillary Oral Oral  SpO2: 96% 98% 96% 97%  Weight:      Height:        Intake/Output Summary (Last 24 hours) at 10/09/15 1236  Last data filed at 10/09/15 0300  Gross per 24 hour  Intake                0 ml  Output                0 ml  Net                0 ml   Filed Weights   10/08/15 2014  Weight: 69.3 kg (152 lb 11.2 oz)    Examination:  General exam: Appears calm and comfortable.Patient  wearing a black eye on the right.  Respiratory system: Clear to auscultation anterior lung fields. Respiratory effort normal. Cardiovascular system: S1 & S2 heard, RRR. No JVD, murmurs, rubs, gallops or clicks. No pedal edema. Gastrointestinal system: Abdomen is nondistended, soft and nontender. No organomegaly or masses felt. Normal bowel sounds heard. Central nervous system: Alert and oriented. No focal neurological deficits. Extremities: Symmetric 5 x 5 power. Skin: No rashes, lesions or ulcers Psychiatry: Judgement and insight appear normal. Mood & affect appropriate.     Data Reviewed: I have personally reviewed following labs and imaging studies  CBC:  Recent Labs Lab 10/08/15 1330  WBC 7.5  NEUTROABS 5.6  HGB 14.9  HCT 44.3  MCV 87.0  PLT 129*   Basic Metabolic Panel:  Recent Labs Lab 10/08/15 1330 10/09/15 0918  NA 136  --   K 3.1*  --   CL 100*  --   CO2 28  --   GLUCOSE 91  --   BUN 7  --   CREATININE 0.81  --   CALCIUM 9.0  --   MG  --  1.9   GFR: Estimated Creatinine Clearance: 80.8 mL/min (by C-G formula based on SCr of 0.81 mg/dL). Liver Function Tests:  Recent Labs Lab 10/08/15 1330  AST 31  ALT 13*  ALKPHOS 81  BILITOT 1.2  PROT 6.8  ALBUMIN 3.3*   No results for input(s): LIPASE, AMYLASE in the last 168 hours. No results for input(s): AMMONIA in the last 168 hours. Coagulation Profile: No results for input(s): INR, PROTIME in the last 168 hours. Cardiac Enzymes:  Recent Labs Lab 10/08/15 1330 10/08/15 1852 10/09/15 0532  CKTOTAL 718* 663* 367  TROPONINI <0.03  --   --    BNP (last 3 results) No results for input(s): PROBNP in the last 8760 hours. HbA1C: No results for input(s): HGBA1C in the last 72 hours. CBG: No results for input(s): GLUCAP in the last 168 hours. Lipid Profile: No results for input(s): CHOL, HDL, LDLCALC, TRIG, CHOLHDL, LDLDIRECT in the last 72 hours. Thyroid Function Tests: No results for input(s): TSH,  T4TOTAL, FREET4, T3FREE, THYROIDAB in the last 72 hours. Anemia Panel: No results for input(s): VITAMINB12, FOLATE, FERRITIN, TIBC, IRON, RETICCTPCT in the last 72 hours. Sepsis Labs: No results for input(s): PROCALCITON, LATICACIDVEN in the last 168 hours.  No results found for this or any previous visit (from the past 240 hour(s)).       Radiology Studies: Dg Wrist Complete Left  Result Date: 10/08/2015 CLINICAL DATA:  Status post fall today with lacerations and swelling about the left wrist. Initial encounter. EXAM: LEFT WRIST - COMPLETE 3+ VIEW COMPARISON:  None. FINDINGS: Soft tissues about the wrist appear swollen. No radiopaque foreign body is seen. No acute bony or joint abnormality is identified. Degenerative change of the radiocarpal joint, first CMC and scaphoid trapezium trapezoid joints is noted. IMPRESSION: Soft tissue swelling without underlying acute  bony or joint abnormality. Electronically Signed   By: Drusilla Kanner M.D.   On: 10/08/2015 13:21   Ct Head Wo Contrast  Result Date: 10/08/2015 CLINICAL DATA:  Fall with head injury.  History of dementia. EXAM: CT HEAD WITHOUT CONTRAST CT MAXILLOFACIAL WITHOUT CONTRAST CT CERVICAL SPINE WITHOUT CONTRAST TECHNIQUE: Multidetector CT imaging of the head, cervical spine, and maxillofacial structures were performed using the standard protocol without intravenous contrast. Multiplanar CT image reconstructions of the cervical spine and maxillofacial structures were also generated. COMPARISON:  CT of the head and cervical spine on 09/14/2015. FINDINGS: CT HEAD FINDINGS Stable advanced small vessel ischemic changes in the periventricular white matter and associated cortical atrophy. The brain demonstrates no evidence of hemorrhage, infarction, edema, mass effect, extra-axial fluid collection, hydrocephalus or mass lesion. The skull is unremarkable. New since the prior study is mucosal thickening within left-sided sinuses including frontal,  ethmoid, maxillary and sphenoid sinuses. CT MAXILLOFACIAL FINDINGS No acute maxillofacial fracture is identified. As seen on the head CT, mucosal sinus disease present with mucosal thickening within left-sided frontal, ethmoid, sphenoid and maxillary air cells. There is a retention cyst in the lower right maxillary antrum. Orbits have a normal appearance bilaterally. The temporomandibular joint show normal alignment. The nasal septum is mildly deviated to the left. CT CERVICAL SPINE FINDINGS The cervical spine shows normal alignment. There is no evidence of acute fracture or subluxation. No soft tissue swelling or hematoma is identified. Spondylosis throughout most of the cervical spine appears stable since the prior study. No bony or soft tissue lesions are seen. The visualized airway is normally patent. IMPRESSION: 1. No acute findings by head CT with stable advanced small vessel disease and atrophy. 2. No acute maxillofacial fracture identified. New mucosal sinus disease since the prior head CT affecting left-sided sinuses. 3. No evidence of acute cervical spine injury. Electronically Signed   By: Irish Lack M.D.   On: 10/08/2015 13:12   Ct Cervical Spine Wo Contrast  Result Date: 10/08/2015 CLINICAL DATA:  Fall with head injury.  History of dementia. EXAM: CT HEAD WITHOUT CONTRAST CT MAXILLOFACIAL WITHOUT CONTRAST CT CERVICAL SPINE WITHOUT CONTRAST TECHNIQUE: Multidetector CT imaging of the head, cervical spine, and maxillofacial structures were performed using the standard protocol without intravenous contrast. Multiplanar CT image reconstructions of the cervical spine and maxillofacial structures were also generated. COMPARISON:  CT of the head and cervical spine on 09/14/2015. FINDINGS: CT HEAD FINDINGS Stable advanced small vessel ischemic changes in the periventricular white matter and associated cortical atrophy. The brain demonstrates no evidence of hemorrhage, infarction, edema, mass effect,  extra-axial fluid collection, hydrocephalus or mass lesion. The skull is unremarkable. New since the prior study is mucosal thickening within left-sided sinuses including frontal, ethmoid, maxillary and sphenoid sinuses. CT MAXILLOFACIAL FINDINGS No acute maxillofacial fracture is identified. As seen on the head CT, mucosal sinus disease present with mucosal thickening within left-sided frontal, ethmoid, sphenoid and maxillary air cells. There is a retention cyst in the lower right maxillary antrum. Orbits have a normal appearance bilaterally. The temporomandibular joint show normal alignment. The nasal septum is mildly deviated to the left. CT CERVICAL SPINE FINDINGS The cervical spine shows normal alignment. There is no evidence of acute fracture or subluxation. No soft tissue swelling or hematoma is identified. Spondylosis throughout most of the cervical spine appears stable since the prior study. No bony or soft tissue lesions are seen. The visualized airway is normally patent. IMPRESSION: 1. No acute findings by head CT with  stable advanced small vessel disease and atrophy. 2. No acute maxillofacial fracture identified. New mucosal sinus disease since the prior head CT affecting left-sided sinuses. 3. No evidence of acute cervical spine injury. Electronically Signed   By: Irish Lack M.D.   On: 10/08/2015 13:12   Dg Humerus Left  Result Date: 10/08/2015 CLINICAL DATA:  Left upper arm pain and laceration secondary to a fall today. EXAM: LEFT HUMERUS - 2+ VIEW COMPARISON:  None. FINDINGS: There is no evidence of fracture or other focal bone lesions. Soft tissues are unremarkable. IMPRESSION: Negative. Electronically Signed   By: Francene Boyers M.D.   On: 10/08/2015 13:28   Ct Maxillofacial Wo Contrast  Result Date: 10/08/2015 CLINICAL DATA:  Fall with head injury.  History of dementia. EXAM: CT HEAD WITHOUT CONTRAST CT MAXILLOFACIAL WITHOUT CONTRAST CT CERVICAL SPINE WITHOUT CONTRAST TECHNIQUE:  Multidetector CT imaging of the head, cervical spine, and maxillofacial structures were performed using the standard protocol without intravenous contrast. Multiplanar CT image reconstructions of the cervical spine and maxillofacial structures were also generated. COMPARISON:  CT of the head and cervical spine on 09/14/2015. FINDINGS: CT HEAD FINDINGS Stable advanced small vessel ischemic changes in the periventricular white matter and associated cortical atrophy. The brain demonstrates no evidence of hemorrhage, infarction, edema, mass effect, extra-axial fluid collection, hydrocephalus or mass lesion. The skull is unremarkable. New since the prior study is mucosal thickening within left-sided sinuses including frontal, ethmoid, maxillary and sphenoid sinuses. CT MAXILLOFACIAL FINDINGS No acute maxillofacial fracture is identified. As seen on the head CT, mucosal sinus disease present with mucosal thickening within left-sided frontal, ethmoid, sphenoid and maxillary air cells. There is a retention cyst in the lower right maxillary antrum. Orbits have a normal appearance bilaterally. The temporomandibular joint show normal alignment. The nasal septum is mildly deviated to the left. CT CERVICAL SPINE FINDINGS The cervical spine shows normal alignment. There is no evidence of acute fracture or subluxation. No soft tissue swelling or hematoma is identified. Spondylosis throughout most of the cervical spine appears stable since the prior study. No bony or soft tissue lesions are seen. The visualized airway is normally patent. IMPRESSION: 1. No acute findings by head CT with stable advanced small vessel disease and atrophy. 2. No acute maxillofacial fracture identified. New mucosal sinus disease since the prior head CT affecting left-sided sinuses. 3. No evidence of acute cervical spine injury. Electronically Signed   By: Irish Lack M.D.   On: 10/08/2015 13:12        Scheduled Meds: . aspirin EC  81 mg Oral  Daily  . atorvastatin  20 mg Oral Daily  . clopidogrel  75 mg Oral Daily  . donepezil  10 mg Oral QHS  . memantine  7 mg Oral Daily   Continuous Infusions: . sodium chloride 75 mL/hr at 10/09/15 0643     LOS: 0 days    Time spent: 35 mins    Jemina Scahill, MD [facility] Pager [phone]  If 7PM-7AM, please contact night-coverage [email or web address] Password [facility] 10/09/2015, 12:36 PM

## 2015-10-09 NOTE — Progress Notes (Signed)
Spoke with Dr Toniann Fail who happened to be in the unit at 2230 to help clarify pt's orders as there are still no stroke orders, he said based on the admission doctor's note, pt should be monitored on telemetry  and do neuro checks every 4 hours this morning, to be reviewed in the morning, pt quiet in bed. [place], Yakov Bergen Efe

## 2015-10-09 NOTE — Consult Note (Signed)
WOC Nurse wound consult note Reason for Consult: Consult requested for left arm abrasion after a fall occurred at home. Wound type: Full thickness Measurement: 2.5X7X.1cm Wound bed: 80% red and moist, 10% black old dried blood, 10% loose yellow skin Drainage (amount, consistency, odor) small amt yellow drainage, no odor Periwound: Intact skin surrounding Dressing procedure/placement/frequency: Foam dressing to protect and promote healing.  Discussed plan of care with patient and family who verbalized understanding. Please re-consult if further assistance is needed.  Thank-you,  Cammie Mcgee MSN, RN, CWOCN, [place], CNS [phone]

## 2015-10-09 NOTE — Progress Notes (Signed)
STROKE TEAM PROGRESS NOTE   HISTORY OF PRESENT ILLNESS (per record) Terry Francis is an 70 y.o. male with end stage dementia who is full ADL and 90% bed and chair bound. Per wife he will even fall with his walker.  He was on Aricept in the past but recently started refusing to take the medication. His wife works 3rd shift and has no help at home. She states the falling has progressively become worse to the point she cannot care for him. HE has several areas of ecchymosis and skin tears on his arms due to falling. MRI obtained 2 days prior shows bilateral small infarcts. Currently he is in bed with makeshift C-collar, right black eye and left arm in splint. He is unable to tell me the date or year and only able to follow simple commands. His last known well is unable to be determined. Modified Rankin: Rankin Score=5. Patient was not administered IV t-PA secondary to unclear LKW. He was admitted for further evaluation and treatment.   SUBJECTIVE (INTERVAL HISTORY) His wife is at the bedside.  She works during the day and the patient stays alone. Unsure this will be safe at discharge -> Dr. Pearlean Brownie discussed with them. Overall he feels his condition is stable.    OBJECTIVE Temp:  [97.3 F (36.3 C)-98.6 F (37 C)] 97.5 F (36.4 C) (08/10 0901) Pulse Rate:  [71-88] 81 (08/10 0901) Cardiac Rhythm: Normal sinus rhythm (08/10 0700) Resp:  [15-27] 20 (08/10 0901) BP: (106-179)/(49-96) 149/61 (08/10 0901) SpO2:  [96 %-99 %] 97 % (08/10 0901) Weight:  [69.3 kg (152 lb 11.2 oz)] 69.3 kg (152 lb 11.2 oz) (08/09 2014)  CBC:  Recent Labs Lab 10/08/15 1330  WBC 7.5  NEUTROABS 5.6  HGB 14.9  HCT 44.3  MCV 87.0  PLT 129*    Basic Metabolic Panel:  Recent Labs Lab 10/08/15 1330 10/09/15 0918  NA 136  --   K 3.1*  --   CL 100*  --   CO2 28  --   GLUCOSE 91  --   BUN 7  --   CREATININE 0.81  --   CALCIUM 9.0  --   MG  --  1.9    Lipid Panel:    Component Value Date/Time   CHOL 119  06/12/2014 1000   TRIG 123 06/12/2014 1000   HDL 26 (L) 06/12/2014 1000   CHOLHDL 4.6 06/12/2014 1000   VLDL 25 06/12/2014 1000   LDLCALC 68 06/12/2014 1000   HgbA1c:  Lab Results  Component Value Date   HGBA1C 6.2 (H) 08/22/2013   Urine Drug Screen: No results found for: LABOPIA, COCAINSCRNUR, LABBENZ, AMPHETMU, THCU, LABBARB    IMAGING  Dg Wrist Complete Left  Result Date: 10/08/2015 CLINICAL DATA:  Status post fall today with lacerations and swelling about the left wrist. Initial encounter. EXAM: LEFT WRIST - COMPLETE 3+ VIEW COMPARISON:  None. FINDINGS: Soft tissues about the wrist appear swollen. No radiopaque foreign body is seen. No acute bony or joint abnormality is identified. Degenerative change of the radiocarpal joint, first CMC and scaphoid trapezium trapezoid joints is noted. IMPRESSION: Soft tissue swelling without underlying acute bony or joint abnormality. Electronically Signed   By: Drusilla Kanner M.D.   On: 10/08/2015 13:21   Ct Head Wo Contrast  Result Date: 10/08/2015 CLINICAL DATA:  Fall with head injury.  History of dementia. EXAM: CT HEAD WITHOUT CONTRAST CT MAXILLOFACIAL WITHOUT CONTRAST CT CERVICAL SPINE WITHOUT CONTRAST TECHNIQUE: Multidetector CT imaging  of the head, cervical spine, and maxillofacial structures were performed using the standard protocol without intravenous contrast. Multiplanar CT image reconstructions of the cervical spine and maxillofacial structures were also generated. COMPARISON:  CT of the head and cervical spine on 09/14/2015. FINDINGS: CT HEAD FINDINGS Stable advanced small vessel ischemic changes in the periventricular white matter and associated cortical atrophy. The brain demonstrates no evidence of hemorrhage, infarction, edema, mass effect, extra-axial fluid collection, hydrocephalus or mass lesion. The skull is unremarkable. New since the prior study is mucosal thickening within left-sided sinuses including frontal, ethmoid, maxillary  and sphenoid sinuses. CT MAXILLOFACIAL FINDINGS No acute maxillofacial fracture is identified. As seen on the head CT, mucosal sinus disease present with mucosal thickening within left-sided frontal, ethmoid, sphenoid and maxillary air cells. There is a retention cyst in the lower right maxillary antrum. Orbits have a normal appearance bilaterally. The temporomandibular joint show normal alignment. The nasal septum is mildly deviated to the left. CT CERVICAL SPINE FINDINGS The cervical spine shows normal alignment. There is no evidence of acute fracture or subluxation. No soft tissue swelling or hematoma is identified. Spondylosis throughout most of the cervical spine appears stable since the prior study. No bony or soft tissue lesions are seen. The visualized airway is normally patent. IMPRESSION: 1. No acute findings by head CT with stable advanced small vessel disease and atrophy. 2. No acute maxillofacial fracture identified. New mucosal sinus disease since the prior head CT affecting left-sided sinuses. 3. No evidence of acute cervical spine injury. Electronically Signed   By: Irish Lack M.D.   On: 10/08/2015 13:12   Ct Cervical Spine Wo Contrast  Result Date: 10/08/2015 CLINICAL DATA:  Fall with head injury.  History of dementia. EXAM: CT HEAD WITHOUT CONTRAST CT MAXILLOFACIAL WITHOUT CONTRAST CT CERVICAL SPINE WITHOUT CONTRAST TECHNIQUE: Multidetector CT imaging of the head, cervical spine, and maxillofacial structures were performed using the standard protocol without intravenous contrast. Multiplanar CT image reconstructions of the cervical spine and maxillofacial structures were also generated. COMPARISON:  CT of the head and cervical spine on 09/14/2015. FINDINGS: CT HEAD FINDINGS Stable advanced small vessel ischemic changes in the periventricular white matter and associated cortical atrophy. The brain demonstrates no evidence of hemorrhage, infarction, edema, mass effect, extra-axial fluid  collection, hydrocephalus or mass lesion. The skull is unremarkable. New since the prior study is mucosal thickening within left-sided sinuses including frontal, ethmoid, maxillary and sphenoid sinuses. CT MAXILLOFACIAL FINDINGS No acute maxillofacial fracture is identified. As seen on the head CT, mucosal sinus disease present with mucosal thickening within left-sided frontal, ethmoid, sphenoid and maxillary air cells. There is a retention cyst in the lower right maxillary antrum. Orbits have a normal appearance bilaterally. The temporomandibular joint show normal alignment. The nasal septum is mildly deviated to the left. CT CERVICAL SPINE FINDINGS The cervical spine shows normal alignment. There is no evidence of acute fracture or subluxation. No soft tissue swelling or hematoma is identified. Spondylosis throughout most of the cervical spine appears stable since the prior study. No bony or soft tissue lesions are seen. The visualized airway is normally patent. IMPRESSION: 1. No acute findings by head CT with stable advanced small vessel disease and atrophy. 2. No acute maxillofacial fracture identified. New mucosal sinus disease since the prior head CT affecting left-sided sinuses. 3. No evidence of acute cervical spine injury. Electronically Signed   By: Irish Lack M.D.   On: 10/08/2015 13:12   Dg Humerus Left  Result Date: 10/08/2015 CLINICAL  DATA:  Left upper arm pain and laceration secondary to a fall today. EXAM: LEFT HUMERUS - 2+ VIEW COMPARISON:  None. FINDINGS: There is no evidence of fracture or other focal bone lesions. Soft tissues are unremarkable. IMPRESSION: Negative. Electronically Signed   By: Francene Boyers M.D.   On: 10/08/2015 13:28   Ct Maxillofacial Wo Contrast  Result Date: 10/08/2015 CLINICAL DATA:  Fall with head injury.  History of dementia. EXAM: CT HEAD WITHOUT CONTRAST CT MAXILLOFACIAL WITHOUT CONTRAST CT CERVICAL SPINE WITHOUT CONTRAST TECHNIQUE: Multidetector CT imaging  of the head, cervical spine, and maxillofacial structures were performed using the standard protocol without intravenous contrast. Multiplanar CT image reconstructions of the cervical spine and maxillofacial structures were also generated. COMPARISON:  CT of the head and cervical spine on 09/14/2015. FINDINGS: CT HEAD FINDINGS Stable advanced small vessel ischemic changes in the periventricular white matter and associated cortical atrophy. The brain demonstrates no evidence of hemorrhage, infarction, edema, mass effect, extra-axial fluid collection, hydrocephalus or mass lesion. The skull is unremarkable. New since the prior study is mucosal thickening within left-sided sinuses including frontal, ethmoid, maxillary and sphenoid sinuses. CT MAXILLOFACIAL FINDINGS No acute maxillofacial fracture is identified. As seen on the head CT, mucosal sinus disease present with mucosal thickening within left-sided frontal, ethmoid, sphenoid and maxillary air cells. There is a retention cyst in the lower right maxillary antrum. Orbits have a normal appearance bilaterally. The temporomandibular joint show normal alignment. The nasal septum is mildly deviated to the left. CT CERVICAL SPINE FINDINGS The cervical spine shows normal alignment. There is no evidence of acute fracture or subluxation. No soft tissue swelling or hematoma is identified. Spondylosis throughout most of the cervical spine appears stable since the prior study. No bony or soft tissue lesions are seen. The visualized airway is normally patent. IMPRESSION: 1. No acute findings by head CT with stable advanced small vessel disease and atrophy. 2. No acute maxillofacial fracture identified. New mucosal sinus disease since the prior head CT affecting left-sided sinuses. 3. No evidence of acute cervical spine injury. Electronically Signed   By: Irish Lack M.D.   On: 10/08/2015 13:12    PHYSICAL EXAM Elderly male currently not in distress.  . Afebrile. Head  is nontraumatic. Neck is supple without bruit.    Cardiac exam no murmur or gallop. Lungs are clear to auscultation. Distal pulses are well felt. He has multiple skin bruises over the arms as well as around the right eye. Neurological Exam :  Awake alert disoriented 3. Speech is slightly nonfluent but no dysarthria or aphasia. Diminished attention, registration and recall. Able to follow only simple midline and one-step commands. Extraocular moments are full range without nystagmus. Blinks to threat bilaterally. Pupils equal reactive. Fundi were not visualized. Face is symmetric without weakness. Tongue is midline. Motor system exam reveals mild subjective weakness of the left hand and leg but effort is poor and variable. No significant weakness in the right side. Sensation is intact bilaterally. The tip in for symmetric. Plantars are downgoing. Gait was not tested. ASSESSMENT/PLAN Terry Francis is a 70 y.o. male with history of end-stage dementia presenting following a fall. 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   Stroke:  Scattered bilateral acute and subacute infarcts in setting of scattered microhemorrhages (? Hypertensive vs Amyloid) secondary to small vessel disease source  MRI  Scattered bilateral acute and subacute infarcts. Significant progression of atrophy and small vessel disease. Scattered microhemorrhages (? Hypertensive vs  amyloid)  MRA  Not ordered  Carotid Doppler  pending   2D Echo  pending   LDL ordered for am - discharge on statin if going home today  HgbA1c ordered  SCDs for VTE prophylaxis  Diet Heart Room service appropriate? Yes; Fluid consistency: Thin  clopidogrel 75 mg daily prior to admission, now on clopidogrel 75 mg daily. Continue at discharge  Patient counseled to be compliant with his antithrombotic medications  Ongoing aggressive stroke risk factor management  Therapy recommendations:  SNF  Disposition:  pending    Hypertension  Stable  Permissive hypertension (OK if < 220/120) but gradually normalize in 5-7 days  Long-term BP goal normotensive  Hyperlipidemia  Home meds:  zocor 20, resumed in hospital  LDL pending, goal < 70  Continue statin at discharge  Other Stroke Risk Factors  Advanced age  Cigarette smoker, advised to stop smoking  ETOH use, advised to drink no more than 2 drink(s) a day  Severe  End-stage Alzhemiers Dementia  Not on meds per wife request  Other Active Problems  Rheumatoid arthritis  jhypothyroidism  Hyperbilirubinemia  decondiditoned with fall risk    Hospital day # 0  [facility] See Amion for Pager information 10/09/2015 3:37 PM   I have personally examined this patient, reviewed notes, independently viewed imaging studies, participated in medical decision making and plan of care. I have made any additions or clarifications directly to the above note. Agree with note above.  Patient presented with recurrent falls and MRI scan shows multiple acute and subacute lacunar infarcts likely due to small vessel disease. He remains at risk for neurological worsening, recurrent stroke, TIAs. Patient has significant baseline dementia and is bed and wheelchair bound since his prognosis is quite poor. I had a long discussion with the patient and his wife at the bedside and answered questions. Greater than 50% of time during this 25 minute visit was spent on counseling and coordination of care about stroke risk, prevention and treatment  Delia Heady, MD Medical Director [facility] Pager: [phone] 10/09/2015 4:28 PM   To contact Stroke Continuity provider, please refer to [email or web address]. After hours, contact General Neurology

## 2015-10-09 NOTE — Consult Note (Signed)
Consultation Note Date: 10/09/2015   Patient Name: Terry Francis  DOB: Oct 18, 1945  MRN: [id number]  Age / Sex: 70 y.o., male  PCP: Terry Brooks, MD Referring Physician: Rodolph Bong, MD  Reason for Consultation: Establishing goals of care  HPI/Patient Profile: 70 y.o. male   admitted on 10/08/2015    Clinical Assessment and Goals of Care:  70 yo gentleman who lives at home with his wife. He has dementia, he has had strokes in the past. Also has HLD, HTN, RA. Wife works 3rd shift. Patient with recent history of recurrent falls at home. No difficulty eating. Comes in with falls, brain imaging with MRI brain shows sub acute atrophy micro hemorrhages secondary to HTN and/or amyloidosis. Not deemed appropriate candidate for anti coagulation due to falls. Palliative care consulted for goals of care discussions.   Patient is resting in bed, he opens eyes to voice command but does not verbalize with me. Call placed and discussed with wife Terry Francis. I introduced myself and palliative medicine as follows: Palliative medicine is specialized medical care for people living with serious illness. It focuses on providing relief from the symptoms and stress of a serious illness. The goal is to improve quality of life for both the patient and the family.  Brief life review performed, Terry Francis states patient has had functional and cognitive decline for the past few months now. She has caregiver fatigue. She works 3rd shift and often comes home to find him on the floor after a fall. She does not believe he is safe to come home. Discussed about poor prognosis and ongoing decline from dementia and stroke stand point, she is accepting of hospice services being added as an extra layer of support. See below.   NEXT OF KIN  wife Terry Francis [phone]   SUMMARY OF RECOMMENDATIONS    DNR DNI Wife states that the patient is not safe to  be at home, recurrent falls, declining from dementia stand point, now with stroke.  Wife is accepting of Hospice services Disposition: SNF rehab with palliative if at all feasible, if not, then consider residential hospice after observing this patient's disease trajectory for the next 24 hours or so.   Code Status/Advance Care Planning:  DNR    Symptom Management:    continue current mode of care.   Palliative Prophylaxis:   Bowel Regimen  Psycho-social/Spiritual:   Desire for further Chaplaincy support:no  Additional Recommendations: Education on Hospice  Prognosis:   < 6 months?  Discharge Planning: Skilled Nursing Facility for rehab with Palliative care service follow-up versus residential hospice.       Primary Diagnoses: Present on Admission: . Subclinical hypothyroidism . Mixed Alzheimer's and vascular dementia . Syncope and collapse . CVA (cerebral infarction)   I have reviewed the medical record, interviewed the patient and family, and examined the patient. The following aspects are pertinent.  Past Medical History:  Diagnosis Date  . Dementia   . Hyperlipidemia   . Hypertension   . RA (rheumatoid  arthritis) (HCC)   . Stroke [facility])    chronic microvascular damage in basal ganglia and CVA in R cerebellum   Social History   Social History  . Marital status: Married    Spouse name: N/A  . Number of children: N/A  . Years of education: N/A   Social History Main Topics  . Smoking status: Current Every Day Smoker    Packs/day: 1.00    Years: 50.00    Types: Cigarettes  . Smokeless tobacco: Never Used  . Alcohol use Yes  . Drug use: No  . Sexual activity: Yes   Other Topics Concern  . None   Social History Narrative   Pt lives with his wife, Terry Francis, in a one story home. He completed the 7th grade.    No family history on file. Scheduled Meds: . aspirin EC  81 mg Oral Daily  . atorvastatin  20 mg Oral Daily  . clopidogrel  75 mg Oral Daily    . donepezil  10 mg Oral QHS  . memantine  7 mg Oral Daily   Continuous Infusions: . sodium chloride 75 mL/hr at 10/09/15 0643   PRN Meds:.hydrALAZINE, meclizine, naproxen Medications Prior to Admission:  Prior to Admission medications   Medication Sig Start Date End Date Taking? Authorizing Provider  acetaminophen (TYLENOL) 325 MG tablet Take 650 mg by mouth every 6 (six) hours as needed.   Yes Historical Provider, MD  naproxen sodium (ANAPROX) 220 MG tablet Take 220 mg by mouth 2 (two) times daily as needed (pain). Reported on 09/15/2015   Yes Historical Provider, MD  atorvastatin (LIPITOR) 20 MG tablet Take 1 tablet (20 mg total) by mouth daily. Patient not taking: Reported on 09/14/2015 06/26/14   Terry Brooks, MD  clopidogrel (PLAVIX) 75 MG tablet Take 1 tablet (75 mg total) by mouth daily. Patient not taking: Reported on 09/14/2015 11/12/14   Terry Brooks, MD  donepezil (ARICEPT) 10 MG tablet Take 1 tablet (10 mg total) by mouth at bedtime. Patient not taking: Reported on 09/14/2015 06/11/14   Terry Brooks, MD  meclizine (ANTIVERT) 25 MG tablet TAKE 1 TABLET BY MOUTH 3 TIMES DAILY AS NEEDED FOR DIZZINESS. Patient not taking: Reported on 09/14/2015 12/04/13   Salley Scarlet, MD  Memantine HCl ER (NAMENDA XR TITRATION PACK) 7 & 14 & 21 &28 MG CP24 Follow instructions on box Patient not taking: Reported on 09/14/2015 12/23/14   Drema Dallas, DO   No Known Allergies Review of Systems  Physical Exam Non verbal on my visit Cachectic with contractures Bruises on both UE Shallow regular breathing S1 S2 Abdomen soft  Vital Signs: BP (!) 149/61 (BP Location: Right Arm)   Pulse 81   Temp 97.5 F (36.4 C) (Oral)   Resp 20   Ht 6' (1.829 m)   Wt 69.3 kg (152 lb 11.2 oz)   SpO2 97%   BMI 20.71 kg/m  Pain Assessment: No/denies pain   Pain Score: 0-No pain   SpO2: SpO2: 97 % O2 Device:SpO2: 97 % O2 Flow Rate: .   IO: Intake/output summary:  Intake/Output Summary (Last  24 hours) at 10/09/15 1452 Last data filed at 10/09/15 0300  Gross per 24 hour  Intake                0 ml  Output                0 ml  Net  0 ml    LBM: Last BM Date: 10/08/15 Baseline Weight: Weight: 69.3 kg (152 lb 11.2 oz) Most recent weight: Weight: 69.3 kg (152 lb 11.2 oz)     Palliative Assessment/Data:   Flowsheet Rows   Flowsheet Row Most Recent Value  Intake Tab  Referral Department  Hospitalist  Unit at Time of Referral  Cardiac/Telemetry Unit  Palliative Care Primary Diagnosis  Neurology  Palliative Care Type  New Palliative care  Reason for referral  Clarify Goals of Care  Date first seen by Palliative Care  10/09/15  Clinical Assessment  Palliative Performance Scale Score  20%  Pain Max last 24 hours  6  Pain Min Last 24 hours  5  Dyspnea Max Last 24 Hours  6  Dyspnea Min Last 24 hours  5  Nausea Max Last 24 Hours  6  Nausea Min Last 24 Hours  5  Psychosocial & Spiritual Assessment  Palliative Care Outcomes  Patient/Family meeting held?  Yes  Who was at the meeting?  wife over the phone   Palliative Care Outcomes  Clarified goals of care      Time In:  1340 Time Out:  1440 Time Total:  60 min  Greater than 50%  of this time was spent counseling and coordinating care related to the above assessment and plan.  Signed by: Rosalin Hawking, MD  [phone]  Please contact Palliative Medicine Team phone at [phone] for questions and concerns.  For individual provider: See Amion password [facility]

## 2015-10-09 NOTE — Evaluation (Signed)
Physical Therapy Evaluation Patient Details Name: Terry Francis MRN: [id number] DOB: 12-22-1945 Today's Date: 10/09/2015   History of Present Illness  Patient is a 70 yo male admitted 10/08/15 after being found down by wife.  MRI showed mult acute/subacute infarcts per chart.    PMH:  severe dementia, RA, HTN, Rt cerebellar CVA, frequent falls  Clinical Impression  Patient presents with problems listed below.  Will benefit from acute PT to maximize functional mobility prior to discharge.  Recommend SNF at d/c for continued therapy.    Follow Up Recommendations SNF;Supervision/Assistance - 24 hour    Equipment Recommendations  Wheelchair (measurements PT);Wheelchair cushion (measurements PT)    Recommendations for Other Services       Precautions / Restrictions Precautions Precautions: Fall Precaution Comments: Frequent falls pta; dementia Restrictions Weight Bearing Restrictions: No      Mobility  Bed Mobility Overal bed mobility: Needs Assistance Bed Mobility: Rolling;Sidelying to Sit;Sit to Sidelying Rolling: Mod assist Sidelying to sit: Max assist     Sit to sidelying: Max assist General bed mobility comments: Verbal and tactile cues to roll.  Patient states "ok" but without initiating movement.  Mod assist to roll to Lt side.  Max assist to raise trunk to sitting position and bring hips forward to EOB for sitting.  Patient able to maintain balance with min guard assist for 2-3 minutes, and then fatigued.  Required max assist to prevent fall to left.  Max assist to return to sidelying position.  Transfers                 General transfer comment: Unable  Ambulation/Gait                Stairs            Wheelchair Mobility    Modified Rankin (Stroke Patients Only) Modified Rankin (Stroke Patients Only) Pre-Morbid Rankin Score: Moderate disability Modified Rankin: Severe disability     Balance Overall balance assessment: Needs  assistance;History of Falls Sitting-balance support: Single extremity supported;Feet supported Sitting balance-Leahy Scale: Poor Sitting balance - Comments: Requires at least single UE support for balance.                                     Pertinent Vitals/Pain Pain Assessment: No/denies pain    Home Living Family/patient expects to be discharged to:: Skilled nursing facility Living Arrangements: Spouse/significant other                    Prior Function Level of Independence:  (Unsure)         Comments: Patient unable to provide information.  Per chart, wife caring for patient pta.     Hand Dominance        Extremity/Trunk Assessment   Upper Extremity Assessment: Defer to OT evaluation           Lower Extremity Assessment: Generalized weakness (Lt weaker than Rt)      Cervical / Trunk Assessment: Kyphotic  Communication   Communication: No difficulties  Cognition Arousal/Alertness: Lethargic Behavior During Therapy: Flat affect Overall Cognitive Status: History of cognitive impairments - at baseline       Memory: Decreased short-term memory              General Comments      Exercises        Assessment/Plan    PT Assessment Patient needs continued  PT services  PT Diagnosis Difficulty walking;Generalized weakness;Altered mental status   PT Problem List Decreased strength;Decreased activity tolerance;Decreased balance;Decreased mobility;Decreased cognition;Decreased knowledge of use of DME;Decreased safety awareness  PT Treatment Interventions DME instruction;Gait training;Functional mobility training;Therapeutic activities;Therapeutic exercise;Balance training;Patient/family education   PT Goals (Current goals can be found in the Care Plan section) Acute Rehab PT Goals Patient Stated Goal: None stated PT Goal Formulation: Patient unable to participate in goal setting Time For Goal Achievement: 10/23/15 Potential to  Achieve Goals: Fair    Frequency Min 2X/week   Barriers to discharge Decreased caregiver support Wife unable to provide level of care now required    Co-evaluation               End of Session   Activity Tolerance: Patient limited by fatigue Patient left: in bed;with call bell/phone within reach;with bed alarm set      Functional Assessment Tool Used: Clinical judgement Functional Limitation: Mobility: Walking and moving around Mobility: Walking and Moving Around Current Status (Z8588): 100 percent impaired, limited or restricted Mobility: Walking and Moving Around Goal Status (F0277): At least 40 percent but less than 60 percent impaired, limited or restricted    Time: 4128-7867 PT Time Calculation (min) (ACUTE ONLY): 9 min   Charges:   PT Evaluation $PT Eval Moderate Complexity: 1 Procedure     PT G Codes:   PT G-Codes **NOT FOR INPATIENT CLASS** Functional Assessment Tool Used: Clinical judgement Functional Limitation: Mobility: Walking and moving around Mobility: Walking and Moving Around Current Status (E7209): 100 percent impaired, limited or restricted Mobility: Walking and Moving Around Goal Status (O7096): At least 40 percent but less than 60 percent impaired, limited or restricted    Vena Austria 10/09/2015, 3:01 PM Durenda Hurt. Renaldo Fiddler, [facility] Acute Rehab Services Pager [phone]

## 2015-10-10 LAB — BASIC METABOLIC PANEL
ANION GAP: 12 (ref 5–15)
BUN: 8 mg/dL (ref 6–20)
CALCIUM: 8.9 mg/dL (ref 8.9–10.3)
CO2: 24 mmol/L (ref 22–32)
Chloride: 100 mmol/L — ABNORMAL LOW (ref 101–111)
Creatinine, Ser: 0.67 mg/dL (ref 0.61–1.24)
GFR calc Af Amer: >60 mL/min (ref >60)
GLUCOSE: 88 mg/dL (ref 65–99)
Potassium: 3.1 mmol/L — ABNORMAL LOW (ref 3.5–5.1)
SODIUM: 136 mmol/L (ref 135–145)

## 2015-10-10 LAB — LIPID PANEL
Cholesterol: 111 mg/dL (ref 0–200)
HDL: 36 mg/dL — ABNORMAL LOW (ref >40)
LDL CALC: 58 mg/dL (ref 0–99)
TRIGLYCERIDES: 83 mg/dL (ref <150)
Total CHOL/HDL Ratio: 3.1 RATIO
VLDL: 17 mg/dL (ref 0–40)

## 2015-10-10 MED ORDER — POTASSIUM CHLORIDE CRYS ER 20 MEQ PO TBCR
40.0000 meq | EXTENDED_RELEASE_TABLET | ORAL | Status: AC
  Administered 2015-10-10 (×2): 40 meq via ORAL
  Filled 2015-10-10 (×2): qty 2

## 2015-10-10 MED ORDER — CLOPIDOGREL BISULFATE 75 MG PO TABS
75.0000 mg | ORAL_TABLET | Freq: Every day | ORAL | Status: DC
  Administered 2015-10-10 – 2015-10-11 (×2): 75 mg via ORAL
  Filled 2015-10-10 (×2): qty 1

## 2015-10-10 NOTE — Evaluation (Signed)
Occupational Therapy Evaluation Patient Details Name: Terry Francis MRN: [id number] DOB: 11/23/1945 Today's Date: 10/10/2015    History of Present Illness Patient is a 70 yo male admitted 10/08/15 after being found down by wife.  MRI showed mult acute/subacute infarcts per chart.    PMH:  severe dementia, RA, HTN, Rt cerebellar CVA, frequent falls   Clinical Impression   PTA, pt required varying level of assistance for ADLs from wife depending on the day per wife report. Pt found to be incontinent on OT arrival and was unaware. Pt requires mod assist for bed mobility and max assist for pericare and bathing at bed level. Discharge disposition planned for SNF. Will defer further OT treatment to next venue of care at this time.     Follow Up Recommendations  SNF;Supervision/Assistance - 24 hour    Equipment Recommendations  Other (comment) (TBD at next venue)    Recommendations for Other Services       Precautions / Restrictions Precautions Precautions: Fall Precaution Comments: Frequent falls pta; dementia Restrictions Weight Bearing Restrictions: No      Mobility Bed Mobility Overal bed mobility: Needs Assistance Bed Mobility: Rolling Rolling: Mod assist         General bed mobility comments: Mod assist with assistance provided by wife while therapist provided pericare and bathing after pt found incontinent. VCs for hand placement, increased time and effort for pt to carry out commands, but able to assist significantly.  Transfers                      Balance                                            ADL Overall ADL's : Needs assistance/impaired     Grooming: Wash/dry hands;Wash/dry face;Set up;Sitting   Upper Body Bathing: Moderate assistance;Bed level   Lower Body Bathing: Moderate assistance;+2 for physical assistance;Sit to/from stand   Upper Body Dressing : Moderate assistance;Bed level   Lower Body Dressing: Moderate  assistance;+2 for physical assistance;Bed level       Toileting- Clothing Manipulation and Hygiene: Moderate assistance;+2 for physical assistance;Bed level         General ADL Comments: Pt incontinent on OT arrival, did not alert wife/staff.      Vision Additional Comments: Difficutlt to assess due to cognitive deficits   Perception     Praxis      Pertinent Vitals/Pain Pain Assessment: No/denies pain     Hand Dominance Right   Extremity/Trunk Assessment Upper Extremity Assessment Upper Extremity Assessment: Generalized weakness (L>R)   Lower Extremity Assessment Lower Extremity Assessment: Generalized weakness (L>R)   Cervical / Trunk Assessment Cervical / Trunk Assessment: Kyphotic   Communication Communication Communication: No difficulties (minimal verbal output during session)   Cognition Arousal/Alertness: Awake/alert Behavior During Therapy: Flat affect Overall Cognitive Status: History of cognitive impairments - at baseline                     General Comments       Exercises       Shoulder Instructions      Home Living Family/patient expects to be discharged to:: Skilled nursing facility Living Arrangements: Spouse/significant other  Prior Functioning/Environment Level of Independence: Needs assistance  Gait / Transfers Assistance Needed: Uses RW ADL's / Homemaking Assistance Needed: Pt's wife states that he has good days and bad days. On good days he can dress and bathe himself with min assist and self-feed, but on bad days he's barely alert.        OT Diagnosis: Generalized weakness;Cognitive deficits;Paresis   OT Problem List: Decreased strength;Decreased range of motion;Decreased activity tolerance;Impaired balance (sitting and/or standing);Decreased coordination;Decreased knowledge of use of DME or AE;Decreased safety awareness;Impaired UE functional use;Impaired tone   OT  Treatment/Interventions:      OT Goals(Current goals can be found in the care plan section) Acute Rehab OT Goals Patient Stated Goal: None stated OT Goal Formulation: Patient unable to participate in goal setting Time For Goal Achievement: 10/24/15 Potential to Achieve Goals: Fair  OT Frequency:     Barriers to D/C:            Co-evaluation              End of Session Nurse Communication: Mobility status  Activity Tolerance: Patient tolerated treatment well Patient left: in bed;with call bell/phone within reach;with bed alarm set   Time: 1510-1539 OT Time Calculation (min): 29 min Charges:  OT General Charges $OT Visit: 1 Procedure OT Evaluation $OT Eval High Complexity: 1 Procedure OT Treatments $Self Care/Home Management : 8-22 mins G-Codes:    Nils Pyle, OTR/L Pager: [phone] 10/10/2015, 4:33 PM

## 2015-10-10 NOTE — Care Management Note (Signed)
Case Management Note  Patient Details  Name: KRISTIN LAMAGNA MRN: [id number] Date of Birth: December 12, 1945  Subjective/Objective:                    Action/Plan: Plan is SNF with palliative vs residential hospice. CM following for discharge needs.   Expected Discharge Date:                  Expected Discharge Plan:     In-House Referral:     Discharge planning Services     Post Acute Care Choice:    Choice offered to:     DME Arranged:    DME Agency:     HH Arranged:    HH Agency:     Status of Service:     If discussed at [employer], dates discussed:    Additional Comments:  Kermit Balo, RN 10/10/2015, 1:42 PM

## 2015-10-10 NOTE — Progress Notes (Addendum)
PROGRESS NOTE    RUSELL Francis  ZOX:[id number] DOB: 04-27-1945 DOA: 10/08/2015 PCP: Leo Grosser, MD   Brief Narrative:  Terry Francis is a 70 y.o. male with a Past Medical History of dementia, HLD, RA, htn, CVA, who presents with fall secondary to progressive weakness and new strokes as seen on MRI. Patient had a very unfortunate condition given his chronic medical conditions. It appears that he has been well taken care of by his wife but she is on able to care for him in the future. Patient's quality of life seems to be deteriorating quickly. Family amenable to palliative care consult for goals of care discussions. Suspect if family were amenable patient may qualify for hospice or at least no further admissions to the hospital given the likelihood that he would succumb to a common location of his chronic conditions such as pneumonia, aspiration, UTI and potential eventual sepsis. Family not wanting aggressive measures or workup at this time.    Assessment & Plan:   Principal Problem:   CVA (cerebral infarction) Active Problems:   Syncope and collapse   Subclinical hypothyroidism   Mixed Alzheimer's and vascular dementia   Frequent falls   Hyperbilirubinemia   Hypertension   Hyperlipidemia   Encounter for palliative care   #1 acute CVA Patient presenting with acute on chronic neurological decline with increasing falls with a prior history of dementia. 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 Patient noted to be noncompliant with his medications was placed on aspirin and Plavix prior to admission. Patient also with a history of dementia and significant fall risk and as such not a anticoagulation candidate. Patient also noted to not be a CEA candidate. Patient has been assessed by neurology who recommended PT/OT/speech therapy. LDL at goal at 58. Patient on a statin. Hemoglobin A1c pending. Patient was  on aspirin yesterday for secondary stroke prevention. Neurology recommended Plavix for secondary stroke prevention. Will change aspirin to Plavix. Risk factor modification. Neurology following. Follow. Palliative care ff.  #2 Alzheimer's dementia Patient's posterior be on Namenda and Aricept, however has been noncompliant with his medication. Continue Namenda and Aricept.  #3 hypertension Permissive hypertension secondary to problem #1. Hydralazine when necessary systolic blood pressure greater than 210. Will start low-dose Norvasc tomorrow.  #4 hypothyroidism Admitting physician wife had requested medication not to be resumed.  #5 hyperlipidemia Fasting lipid panel with LDL of 58. Continue Lipitor.  #6 deconditioning/increased fall risk/ftt Patient has been declining with increased falls and deconditioning and poor oral intake. Continue current diet. Continue hydration with IV fluids. Follow.  #7 hypokalemia Replete.   DVT prophylaxis: SCDs Code Status: DO NOT RESUSCITATE Family Communication: Updated patient and wife at bedside. Disposition Plan: Skilled nursing facility with palliative care versus residential hospice.   Consultants:   Neurology: Dr. Hilda Blades 10/08/2015  Palliative care: Dr Linna Darner 10/09/2015  Procedures:   Plain films of the left humerus 10/08/2015  Plain films of the left wrist 10/08/2015  Antimicrobials:   None   Subjective: Patient denies any chest pain. No shortness of breath.Sitting up in bed. Family at bedside.  Objective: Vitals:   10/10/15 0018 10/10/15 0500 10/10/15 0926 10/10/15 1352  BP: 136/71 (!) 152/78 (!) 142/66 (!) 160/74  Pulse: 83 67 76 69  Resp: 18 18 20 20   Temp: 97.7 F (36.5 C) 97.8 F (36.6 C) 97.7 F (36.5 C) 98.6 F (37 C)  TempSrc: Oral Oral  Oral Oral  SpO2: 98% 99% 97% 98%  Weight:      Height:        Intake/Output Summary (Last 24 hours) at 10/10/15 1820 Last data filed at 10/10/15 1200  Gross per 24 hour    Intake          2976.25 ml  Output              700 ml  Net          2276.25 ml   Filed Weights   10/08/15 2014  Weight: 69.3 kg (152 lb 11.2 oz)    Examination:  General exam: Appears calm and comfortable.Patient with a black eye on the right.  Respiratory system: Clear to auscultation anterior lung fields. Respiratory effort normal. Cardiovascular system: S1 & S2 heard, RRR. No JVD, murmurs, rubs, gallops or clicks. No pedal edema. Gastrointestinal system: Abdomen is nondistended, soft and nontender. No organomegaly or masses felt. Normal bowel sounds heard. Central nervous system: Alert and oriented. No focal neurological deficits. Extremities: Symmetric 5 x 5 power. Skin: No rashes, lesions or ulcers Psychiatry: Judgement and insight appear normal. Mood & affect appropriate.     Data Reviewed: I have personally reviewed following labs and imaging studies  CBC:  Recent Labs Lab 10/08/15 1330  WBC 7.5  NEUTROABS 5.6  HGB 14.9  HCT 44.3  MCV 87.0  PLT 129*   Basic Metabolic Panel:  Recent Labs Lab 10/08/15 1330 10/09/15 0918 10/10/15 0555  NA 136  --  136  K 3.1*  --  3.1*  CL 100*  --  100*  CO2 28  --  24  GLUCOSE 91  --  88  BUN 7  --  8  CREATININE 0.81  --  0.67  CALCIUM 9.0  --  8.9  MG  --  1.9  --    GFR: Estimated Creatinine Clearance: 81.8 mL/min (by C-G formula based on SCr of 0.8 mg/dL). Liver Function Tests:  Recent Labs Lab 10/08/15 1330  AST 31  ALT 13*  ALKPHOS 81  BILITOT 1.2  PROT 6.8  ALBUMIN 3.3*   No results for input(s): LIPASE, AMYLASE in the last 168 hours. No results for input(s): AMMONIA in the last 168 hours. Coagulation Profile: No results for input(s): INR, PROTIME in the last 168 hours. Cardiac Enzymes:  Recent Labs Lab 10/08/15 1330 10/08/15 1852 10/09/15 0532  CKTOTAL 718* 663* 367  TROPONINI <0.03  --   --    BNP (last 3 results) No results for input(s): PROBNP in the last 8760 hours. HbA1C: No  results for input(s): HGBA1C in the last 72 hours. CBG: No results for input(s): GLUCAP in the last 168 hours. Lipid Profile:  Recent Labs  10/10/15 0555  CHOL 111  HDL 36*  LDLCALC 58  TRIG 83  CHOLHDL 3.1   Thyroid Function Tests: No results for input(s): TSH, T4TOTAL, FREET4, T3FREE, THYROIDAB in the last 72 hours. Anemia Panel: No results for input(s): VITAMINB12, FOLATE, FERRITIN, TIBC, IRON, RETICCTPCT in the last 72 hours. Sepsis Labs: No results for input(s): PROCALCITON, LATICACIDVEN in the last 168 hours.  No results found for this or any previous visit (from the past 240 hour(s)).       Radiology Studies: No results found.      Scheduled Meds: . aspirin EC  81 mg Oral Daily  . atorvastatin  20 mg Oral Daily  . donepezil  10 mg Oral QHS  . memantine  7 mg  Oral Daily   Continuous Infusions: . sodium chloride 75 mL/hr at 10/09/15 1958     LOS: 1 day    Time spent: 35 mins    Lesean Woolverton, MD [facility] Pager [phone]  If 7PM-7AM, please contact night-coverage [email or web address] Password [email or web address] 10/10/2015, 6:20 PM

## 2015-10-10 NOTE — Clinical Social Work Note (Signed)
Clinical Social Work Assessment  Patient Details  Name: Terry Francis MRN: [id number] Date of Birth: January 30, 1946  Date of referral:  10/08/15               Reason for consult:  Facility Placement                Permission sought to share information with:  Other (Patient only oriented to person and place, talked with wife at bedside) Permission granted to share information::  No (Patient oriented X2 - talked with wife)  Name::     Terry Francis  Agency::     Relationship::  Wife  Contact Information:  [phone]  Housing/Transportation Living arrangements for the past 2 months:  Single Family Home Source of Information:  Spouse Patient Interpreter Needed:  None Criminal Activity/Legal Involvement Pertinent to Current Situation/Hospitalization:  No - Comment as needed Significant Relationships:  Other Family Members, Adult Children, Spouse Lives with:  Spouse Do you feel safe going back to the place where you live?  No (Wife reported that patient has fallen a lot at home and is not safe) Need for family participation in patient care:  Yes (Comment)  Care giving concerns: Patient's wife very concerned as patient as been falling a lot at home. She is hopeful that rehab will benefit patient and help prevent his frequent falls.   Social Worker assessment / plan:  CSW greeted patient and wife in room and Mrs. Printz stepped outside of room to talk more with CSW.  Talked with wife regarding recommendation of ST rehab and this was discussed. Mrs. Imbert is in agreement and provided CSW with recent history of patient's falls. Mrs. Dresser reported that she works 12 hour shifts through the week and also works every other weekend, 7 am - 7 pm. One of her daughters comes in the evenings to assist with looking after patient. Mrs. Finigan reported that her husband is falls a lot and many times they find him on the floor as he is unable to get up. The last fall before this admission he fell per wife,  could not get up and his wrist was swollen and his arm bruised. Mrs. Cause reported that her daughter Ena Dawley helps out by coming in the evening to assist with her dad. Her other daughter Alvis Lemmings lives in [place], [place]. When asked, Mrs. Scholz feels that her husband's orientation fluctuates due to his dementia. The skilled facility search process explained and Mrs. Winsett was provided with a skilled facility list for [facility]. CSW also talked with wife regarding patient's insurance and possibly have to find a facility that will work with hospital and accept patient prior to insurance authorization due to patient having to discharge when doctor deems him medically stable, and Mrs. Swint expressed understanding.   Employment status:  Retired Health and safety inspector:  [employer]) PT Recommendations:  Skilled Nursing Facility Information / Referral to community resources:  Skilled Holiday representative (Wife provided with skilled faciity list for [facility])  Patient/Family's Response to care:  No concerns expressed regarding patient's care during hospitalization.  Patient/Family's Understanding of and Emotional Response to Diagnosis, Current Treatment, and Prognosis:  Wife expressed concern regarding the mini-strokes patient has been having and the effect it is having on him physically and cognitively.  Emotional Assessment Appearance:  Appears stated age Attitude/Demeanor/Rapport:  Unable to Assess (Talked with wife right outside of room) Affect (typically observed):  Unable to Assess Orientation:  Oriented to Self, Oriented to  Place Alcohol / Substance use:  Tobacco Use, Alcohol Use, Illicit Drugs (Patient reports that he smokes, drinks alcohol and does not use illicit drugs) Psych involvement (Current and /or in the community):  No (Comment)  Discharge Needs  Concerns to be addressed:  Discharge Planning Concerns Readmission within the last 30 days:  No Current  discharge risk:  None Barriers to Discharge:  No Barriers Identified   Cristobal Goldmann, LCSW 10/10/2015, 6:10 PM

## 2015-10-10 NOTE — Clinical Social Work Placement (Signed)
   CLINICAL SOCIAL WORK PLACEMENT  NOTE  Date:  10/10/2015  Patient Details  Name: Terry Francis MRN: [id number] Date of Birth: 1945-12-31  Clinical Social Work is seeking post-discharge placement for this patient at the Skilled  Nursing Facility level of care (*CSW will initial, date and re-position this form in  chart as items are completed):  Yes   Patient/family provided with [facility] Clinical Social Work Department's list of facilities offering this level of care within the geographic area requested by the patient (or if unable, by the patient's family).  Yes   Patient/family informed of their freedom to choose among providers that offer the needed level of care, that participate in Medicare, Medicaid or managed care program needed by the patient, have an available bed and are willing to accept the patient.  Yes   Patient/family informed of [facility]'s ownership interest in [facility] and [facility], as well as of the fact that they are under no obligation to receive care at these facilities.  PASRR submitted to EDS on 10/10/15     PASRR number received on 10/10/15     Existing PASRR number confirmed on       FL2 transmitted to all facilities in geographic area requested by pt/family on 10/10/15     FL2 transmitted to all facilities within larger geographic area on 10/10/15     Patient informed that his/her managed care company has contracts with or will negotiate with certain facilities, including the following:        Yes   Patient/family informed of bed offers received.  Patient chooses bed at [facility]     Physician recommends and patient chooses bed at      Patient to be transferred to [facility] on  .  Patient to be transferred to facility by       Patient family notified on   of transfer.  Name of family member notified:        PHYSICIAN       Additional Comment:     _______________________________________________ Cristobal Goldmann, LCSW 10/10/2015, 6:23 PM

## 2015-10-10 NOTE — [place] FL2 (Signed)
[place] MEDICAID FL2 LEVEL OF CARE SCREENING TOOL     IDENTIFICATION  Patient Name: Terry Francis Birthdate: 10-19-1945 Sex: male Admission Date (Current Location): 10/08/2015  [facility] and [place] Number:  Producer, television/film/video and Address:  [facility] [place], [address], [place], [place] [address]      Provider Number: [id number]  Attending Physician Name and Address:  Rodolph Bong, MD  Relative Name and Phone Number:  Ellie Spickler - wife.  Phone #[phone] - mobile    Current Level of Care: Hospital Recommended Level of Care: Skilled Nursing Facility Prior Approval Number:    Date Approved/Denied:   PASRR Number: [id number] (Eff. 10/10/15)  Discharge Plan: SNF    Current Diagnoses: Patient Active Problem List   Diagnosis Date Noted  . Encounter for palliative care   . Frequent falls 10/08/2015  . Hyperbilirubinemia 10/08/2015  . Hypertension 10/08/2015  . Hyperlipidemia 10/08/2015  . CVA (cerebral infarction) 10/08/2015  . 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 Subclinical hypothyroidism 10/11/2013  . Testosterone deficiency 10/11/2013  . Other malaise and fatigue 08/22/2013  . Abnormal TSH 08/22/2013  . Dizziness and giddiness 08/18/2013  . Carotid bruit 08/18/2013  . Syncope and collapse 08/15/2013    Orientation RESPIRATION BLADDER Height & Weight     Self, Place  Normal Continent Weight: 152 lb 11.2 oz (69.3 kg) Height:  6' (182.9 cm)  BEHAVIORAL SYMPTOMS/MOOD NEUROLOGICAL BOWEL NUTRITION STATUS      Continent Diet (Heart healthy)  AMBULATORY STATUS COMMUNICATION OF NEEDS Skin   Extensive Assist (Patient was unable to ambulate ewith PT on 8/10) Verbally Skin abrasions (Left arm, treated with foam dressing)                       Personal Care Assistance Level of Assistance  Bathing, Feeding, Dressing Bathing  Assistance: Maximum assistance Feeding assistance: Independent (needs assistance with set-up) Dressing Assistance: Maximum assistance     Functional Limitations Info  Sight, Hearing, Speech Sight Info: Adequate Hearing Info: Adequate Speech Info: Adequate    SPECIAL CARE FACTORS FREQUENCY  PT (By licensed PT)     PT Frequency: Evaluated 8/10 and a minimum of 2X per week therapy recommended              Contractures Contractures Info: Not present    Additional Factors Info  Code Status, Allergies Code Status Info: DNR Allergies Info: No known allergies           Current Medications (10/10/2015):  This is the current hospital active medication list Current Facility-Administered Medications  Medication Dose Route Frequency Provider Last Rate Last Dose  . 0.9 %  sodium chloride infusion   Intravenous Continuous Marcos Eke, PA-C 75 mL/hr at 10/09/15 1958    . aspirin EC tablet 81 mg  81 mg Oral Daily Marcos Eke, PA-C   81 mg at 10/10/15 0935  . atorvastatin (LIPITOR) tablet 20 mg  20 mg Oral Daily Faye Ramsay Rumbarger, RPH   20 mg at 10/10/15 0935  . donepezil (ARICEPT) tablet 10 mg  10 mg Oral QHS Rodolph Bong, MD   10 mg at 10/09/15 2005  . hydrALAZINE (APRESOLINE) injection 5-10 mg  5-10 mg Intravenous Q8H PRN Marcos Eke, PA-C      . meclizine (ANTIVERT) tablet 25 mg  25 mg Oral TID PRN Lovey Newcomer  Janee Morn, MD      . memantine (NAMENDA XR) 24 hr capsule 7 mg  7 mg Oral Daily Rodolph Bong, MD   7 mg at 10/10/15 0935  . naproxen (NAPROSYN) tablet 250 mg  250 mg Oral BID PRN Rodolph Bong, MD      . potassium chloride SA (K-DUR,KLOR-CON) CR tablet 40 mEq  40 mEq Oral Q4H Rodolph Bong, MD   40 mEq at 10/10/15 0935     Discharge Medications: Please see discharge summary for a list of discharge medications.  Relevant Imaging Results:  Relevant Lab Results:   Additional Information ss#[id number].  Cristobal Goldmann, LCSW

## 2015-10-10 NOTE — Progress Notes (Signed)
STROKE TEAM PROGRESS NOTE   SUBJECTIVE (INTERVAL HISTORY) Wife at bedside. No complaints   OBJECTIVE Temp:  [97.7 F (36.5 C)-98.5 F (36.9 C)] 97.7 F (36.5 C) (08/11 0926) Pulse Rate:  [67-83] 76 (08/11 0926) Cardiac Rhythm: Sinus bradycardia (08/11 0700) Resp:  [18-20] 20 (08/11 0926) BP: (136-159)/(65-78) 142/66 (08/11 0926) SpO2:  [96 %-100 %] 97 % (08/11 0926)  CBC:   Recent Labs Lab 10/08/15 1330  WBC 7.5  NEUTROABS 5.6  HGB 14.9  HCT 44.3  MCV 87.0  PLT 129*    Basic Metabolic Panel:   Recent Labs Lab 10/08/15 1330 10/09/15 0918 10/10/15 0555  NA 136  --  136  K 3.1*  --  3.1*  CL 100*  --  100*  CO2 28  --  24  GLUCOSE 91  --  88  BUN 7  --  8  CREATININE 0.81  --  0.67  CALCIUM 9.0  --  8.9  MG  --  1.9  --     Lipid Panel:     Component Value Date/Time   CHOL 111 10/10/2015 0555   TRIG 83 10/10/2015 0555   HDL 36 (L) 10/10/2015 0555   CHOLHDL 3.1 10/10/2015 0555   VLDL 17 10/10/2015 0555   LDLCALC 58 10/10/2015 0555   HgbA1c:  Lab Results  Component Value Date   HGBA1C 6.2 (H) 08/22/2013   Urine Drug Screen: No results found for: LABOPIA, COCAINSCRNUR, LABBENZ, AMPHETMU, THCU, LABBARB    IMAGING  No results found.  PHYSICAL EXAM Elderly male currently not in distress.  . Afebrile. Head is nontraumatic. Neck is supple without bruit.    Cardiac exam no murmur or gallop. Lungs are clear to auscultation. Distal pulses are well felt. He has multiple skin bruises over the arms as well as around the right eye. Neurological Exam :  Awake alert disoriented 3. Speech is slightly nonfluent but no dysarthria or aphasia. Diminished attention, registration and recall. Able to follow only simple midline and one-step commands. Extraocular moments are full range without nystagmus. Blinks to threat bilaterally. Pupils equal reactive. Fundi were not visualized. Face is symmetric without weakness. Tongue is midline. Motor system exam reveals mild  subjective weakness of the left hand and leg but effort is poor and variable. No significant weakness in the right side. Sensation is intact bilaterally. The tip in for symmetric. Plantars are downgoing. Gait was not tested. ASSESSMENT/PLAN Mr. Terry Francis is a 70 y.o. male with history of end-stage dementia presenting following a fall. 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   Stroke:  Scattered bilateral acute and subacute infarcts in setting of scattered microhemorrhages (? Hypertensive vs Amyloid) secondary to small vessel disease source  MRI  Scattered bilateral acute and subacute infarcts. Significant progression of atrophy and small vessel disease. Scattered microhemorrhages (? Hypertensive vs amyloid)  MRA  Not ordered  Carotid Doppler  pending   2D Echo  pending   LDL 58  HgbA1c ordered  SCDs for VTE prophylaxis Diet Heart Room service appropriate? Yes; Fluid consistency: Thin  clopidogrel 75 mg daily prior to admission, now on clopidogrel 75 mg daily. Continue at discharge  Patient counseled to be compliant with his antithrombotic medications  Ongoing aggressive stroke risk factor management  Therapy recommendations:  SNF  Disposition:  pending   Hypertension  Stable  Permissive hypertension (OK if < 220/120) but gradually normalize in 5-7 days  Long-term BP goal normotensive  Hyperlipidemia  Home  meds:  zocor 20, resumed in hospital  LDL pending, goal < 70  Continue statin at discharge  Other Stroke Risk Factors  Advanced age  Cigarette smoker, advised to stop smoking  ETOH use, advised to drink no more than 2 drink(s) a day  Severe  End-stage Alzhemiers Dementia  Not on meds per wife request  Other Active Problems  Rheumatoid arthritis  jhypothyroidism  Hyperbilirubinemia  decondiditoned with fall risk    Hospital day # 1  I have personally examined this patient, reviewed notes, independently viewed imaging studies,  participated in medical decision making and plan of care. I have made any additions or clarifications directly to the above note. Agree with note above. Wife has met with palliative care and leaning towards /rehabilitation with palliative  SNF.Marland Kitchen No need to complete stroke workup as family likely wants palliative care only. Stroke team will sign off. Call for questions.  Antony Contras, MD Medical Director [facility] Pager: [phone] 10/10/2015 2:42 PM    To contact Stroke Continuity provider, please refer to [email or web address] After hours, contact General Neurology

## 2015-10-11 LAB — BASIC METABOLIC PANEL
ANION GAP: 8 (ref 5–15)
BUN: 6 mg/dL (ref 6–20)
CALCIUM: 8.7 mg/dL — AB (ref 8.9–10.3)
CO2: 23 mmol/L (ref 22–32)
Chloride: 104 mmol/L (ref 101–111)
Creatinine, Ser: 0.67 mg/dL (ref 0.61–1.24)
GFR calc Af Amer: >60 mL/min (ref >60)
GFR calc non Af Amer: >60 mL/min (ref >60)
GLUCOSE: 87 mg/dL (ref 65–99)
Potassium: 3.9 mmol/L (ref 3.5–5.1)
Sodium: 135 mmol/L (ref 135–145)

## 2015-10-11 LAB — HEMOGLOBIN A1C
HEMOGLOBIN A1C: 5.5 % (ref 4.8–5.6)
Mean Plasma Glucose: 111 mg/dL

## 2015-10-11 MED ORDER — AMLODIPINE BESYLATE 5 MG PO TABS: 5.0000 mg | ORAL_TABLET | Freq: Every day | ORAL | Status: DC

## 2015-10-11 MED ORDER — AMLODIPINE BESYLATE 5 MG PO TABS: 5.0000 mg | ORAL_TABLET | Freq: Every day | ORAL | 0 refills | Status: AC

## 2015-10-11 NOTE — Discharge Summary (Signed)
Physician Discharge Summary  Terry Francis:[id number] DOB: 02/28/1946 DOA: 10/08/2015  PCP: Terry Grosser, MD  Admit date: 10/08/2015 Discharge date: 10/11/2015  Time spent: 65 minutes  Recommendations for Outpatient Follow-up:  1. Patient was discharged to a skilled nursing facility at [name] and rehabilitation with palliative care following. High likelihood to consider full hospice support in the near future through hospice of [facility].   Discharge Diagnoses:  Principal Problem:   CVA (cerebral infarction) Active Problems:   Syncope and collapse   Subclinical hypothyroidism   Mixed Alzheimer's and vascular dementia   Frequent falls   Hyperbilirubinemia   Hypertension   Hyperlipidemia   Encounter for palliative care   Discharge Condition: Stable  Diet recommendation: Heart healthy  Filed Weights   10/08/15 2014  Weight: 69.3 kg (152 lb 11.2 oz)    History of present illness:  Per Dr Terry Francis is a 70 y.o. male with medical history significant for  Alzheimer's dementia, HTN, HLD, prior R cerebellar CVA, RA, presenting to the ED after sustaining a fall. The wife reported that when she returned home after working a 12 hours shift, she found him lying on the ground. He was awake, but it was unclear how long he had been on the ground.when found, he was holding his left wrist. He had significant left arm skin tear, with some bruising on the right forearm, which was immobilized. History was obtained by wife, the patient cannot provide due to dementia. The patient had sustained another fall 2 days prior hitting his right orbital area without a fracture. As he was experiencing frequent falls over the last 3-4 months, his PCP ordered MRI head as OP which was read on the day of admission, showing subacute to acute infarct. Neuro consult had been obtained (Dr. Hilda Blades). Because of his overall clinical decline, and wife unable to care for him, will admit  anticipating that he will need long term placement for care. He is DNR/DNI. Wife denied any other issues such as respiratory or cardiac complaints. No appetite changes, no dysphagia reported. No bleeding issues reported. No seizures. No history of cancer.  Hospital Course:  #1 acute CVA Patient presenting with acute on chronic neurological decline with increasing falls with a prior history of dementia. 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 Patient noted to be noncompliant with his medications was placed on aspirin and Plavix prior to admission. Patient also with a history of dementia and significant fall risk and as such not a anticoagulation candidate. Patient also noted to not be a CEA candidate. Patient has been assessed by neurology who recommended PT/OT/speech therapy. LDL at goal at 58. Patient on a statin. Hemoglobin A1c 5.5. Patient was on aspirin for secondary stroke prevention. Neurology recommended Plavix for secondary stroke prevention. Aspirin was subsequently changed to Plavix for secondary stroke prevention. Neurology recommendations. Patient was also seen by palliative care. Patient will be discharged to a skilled nursing facility with palliative care following.   #2 Alzheimer's dementia Patient's was supposed to be on Namenda and Aricept, however has been noncompliant with his medication. Namenda and Aricept was resumed during this hospitalization..  #3 hypertension Permissive hypertension secondary to problem #1. Hydralazine when necessary systolic blood pressure greater than 210. Patient was subsequently started on low-dose Norvasc.   #4 hypothyroidism Per Admitting physician wife had requested medication not to be resumed. TSH was subsequently not obtained.  #  5 hyperlipidemia Fasting lipid panel with LDL of 58. Patient was maintained on Lipitor.   #6 deconditioning/increased fall  risk/ftt Patient has been declining with increased falls and deconditioning and poor oral intake. Palliative care consultation was obtained per family rest. Patient was seen in consultation by palliative care. Patient noted to be declining with recurrent falls and now with a new stroke. Patient was made a DO NOT RESUSCITATE with symptom management. Was recommended that as family did not want any further invasive workup done patient could be discharged to a skilled nursing facility with palliative care following. Patient will be discharged to a skilled nursing facility.   #7 hypokalemia Repleted.    Procedures:  Plain films of the left humerus 10/08/2015  Plain films of the left wrist 10/08/2015  Consultations:  Neurology: Dr. Hilda Blades 10/08/2015  Palliative care: Dr Linna Darner 10/09/2015  Discharge Exam: Vitals:   10/11/15 0559 10/11/15 1004  BP: 129/71 (!) 165/64  Pulse: 75 67  Resp: 20 18  Temp: 98.5 F (36.9 C) 98.3 F (36.8 C)    General: NAD Cardiovascular: RRR Respiratory: CTAB  Discharge Instructions   Discharge Instructions    Diet - low sodium heart healthy    Complete by:  As directed   Increase activity slowly    Complete by:  As directed     Current Discharge Medication List    START taking these medications   Details  amLODipine (NORVASC) 5 MG tablet Take 1 tablet (5 mg total) by mouth daily. Qty: 30 tablet, Refills: 0      CONTINUE these medications which have NOT CHANGED   Details  acetaminophen (TYLENOL) 325 MG tablet Take 650 mg by mouth every 6 (six) hours as needed.    naproxen sodium (ANAPROX) 220 MG tablet Take 220 mg by mouth 2 (two) times daily as needed (pain). Reported on 09/15/2015    atorvastatin (LIPITOR) 20 MG tablet Take 1 tablet (20 mg total) by mouth daily. Qty: 30 tablet, Refills: 3    clopidogrel (PLAVIX) 75 MG tablet Take 1 tablet (75 mg total) by mouth daily. Qty: 90 tablet, Refills: 3   Associated Diagnoses: History of  stroke    donepezil (ARICEPT) 10 MG tablet Take 1 tablet (10 mg total) by mouth at bedtime. Qty: 30 tablet, Refills: 5   Associated Diagnoses: Dementia with behavioral disturbance    meclizine (ANTIVERT) 25 MG tablet TAKE 1 TABLET BY MOUTH 3 TIMES DAILY AS NEEDED FOR DIZZINESS. Qty: 30 tablet, Refills: 1    Memantine HCl ER (NAMENDA XR TITRATION PACK) 7 & 14 & 21 &28 MG CP24 Follow instructions on box Qty: 28 capsule, Refills: 0   Associated Diagnoses: Mixed Alzheimer's and vascular dementia       No Known Allergies Follow-up Information    MD at SNF .   Why:  F/U with MD at SNF        Palliative care .   Why:  Palliative Care to follow up at facility           The results of significant diagnostics from this hospitalization (including imaging, microbiology, ancillary and laboratory) are listed below for reference.    Significant Diagnostic Studies: Dg Chest 2 View  Result Date: 09/14/2015 CLINICAL DATA:  Increasing weakness. Difficulty standing. Worsening dementia. EXAM: CHEST  2 VIEW COMPARISON:  Two-view chest x-ray 12/23/2014. FINDINGS: The heart size is normal. Mild pulmonary vascular congestion is present without frank edema. There is no ill-defined left lower lobe airspace disease is  noted posteriorly. No other significant airspace consolidation is present. A small left pleural effusion is present. The right lung base is clear. The visualized soft tissues and bony thorax are unremarkable. IMPRESSION: 1. Mild pulmonary vascular congestion. 2. Ill-defined left lower lobe airspace disease and small left pleural effusion. While this may represent atelectasis, infection is also considered. Electronically Signed   By: Marin Roberts M.D.   On: 09/14/2015 14:37   Dg Wrist Complete Left  Result Date: 10/08/2015 CLINICAL DATA:  Status post fall today with lacerations and swelling about the left wrist. Initial encounter. EXAM: LEFT WRIST - COMPLETE 3+ VIEW COMPARISON:  None.  FINDINGS: Soft tissues about the wrist appear swollen. No radiopaque foreign body is seen. No acute bony or joint abnormality is identified. Degenerative change of the radiocarpal joint, first CMC and scaphoid trapezium trapezoid joints is noted. IMPRESSION: Soft tissue swelling without underlying acute bony or joint abnormality. Electronically Signed   By: Drusilla Kanner M.D.   On: 10/08/2015 13:21   Ct Head Wo Contrast  Result Date: 10/08/2015 CLINICAL DATA:  Fall with head injury.  History of dementia. EXAM: CT HEAD WITHOUT CONTRAST CT MAXILLOFACIAL WITHOUT CONTRAST CT CERVICAL SPINE WITHOUT CONTRAST TECHNIQUE: Multidetector CT imaging of the head, cervical spine, and maxillofacial structures were performed using the standard protocol without intravenous contrast. Multiplanar CT image reconstructions of the cervical spine and maxillofacial structures were also generated. COMPARISON:  CT of the head and cervical spine on 09/14/2015. FINDINGS: CT HEAD FINDINGS Stable advanced small vessel ischemic changes in the periventricular white matter and associated cortical atrophy. The brain demonstrates no evidence of hemorrhage, infarction, edema, mass effect, extra-axial fluid collection, hydrocephalus or mass lesion. The skull is unremarkable. New since the prior study is mucosal thickening within left-sided sinuses including frontal, ethmoid, maxillary and sphenoid sinuses. CT MAXILLOFACIAL FINDINGS No acute maxillofacial fracture is identified. As seen on the head CT, mucosal sinus disease present with mucosal thickening within left-sided frontal, ethmoid, sphenoid and maxillary air cells. There is a retention cyst in the lower right maxillary antrum. Orbits have a normal appearance bilaterally. The temporomandibular joint show normal alignment. The nasal septum is mildly deviated to the left. CT CERVICAL SPINE FINDINGS The cervical spine shows normal alignment. There is no evidence of acute fracture or  subluxation. No soft tissue swelling or hematoma is identified. Spondylosis throughout most of the cervical spine appears stable since the prior study. No bony or soft tissue lesions are seen. The visualized airway is normally patent. IMPRESSION: 1. No acute findings by head CT with stable advanced small vessel disease and atrophy. 2. No acute maxillofacial fracture identified. New mucosal sinus disease since the prior head CT affecting left-sided sinuses. 3. No evidence of acute cervical spine injury. Electronically Signed   By: Irish Lack M.D.   On: 10/08/2015 13:12   Ct Head Wo Contrast  Result Date: 09/14/2015 CLINICAL DATA:  Status post fall.  Multiple recent falls. EXAM: CT HEAD WITHOUT CONTRAST CT CERVICAL SPINE WITHOUT CONTRAST TECHNIQUE: Multidetector CT imaging of the head and cervical spine was performed following the standard protocol without intravenous contrast. Multiplanar CT image reconstructions of the cervical spine were also generated. COMPARISON:  MR brain 08/19/2013 FINDINGS: CT HEAD FINDINGS There is no evidence of mass effect, midline shift, or extra-axial fluid collections. There is no evidence of a space-occupying lesion or intracranial hemorrhage. There is no evidence of a cortical-based area of acute infarction. There is generalized cerebral atrophy. There is an old small  infarct in the right cerebellum. There is an old lacunar infarct of the right thalamus. There is periventricular white matter low attenuation likely secondary to microangiopathy. The ventricles and sulci are appropriate for the patient's age. The basal cisterns are patent. Visualized portions of the orbits are unremarkable. The visualized portions of the paranasal sinuses and mastoid air cells are unremarkable. Cerebrovascular atherosclerotic calcifications are noted. The osseous structures are unremarkable. CT CERVICAL SPINE FINDINGS The alignment is anatomic. The vertebral body heights are maintained. There is  no acute fracture. There is no static listhesis. The prevertebral soft tissues are normal. The intraspinal soft tissues are not fully imaged on this examination due to poor soft tissue contrast, but there is no gross soft tissue abnormality. There is degenerative disc disease with disc height loss at C4-5, C5-6 and C6-7. There is bilateral facet arthropathy and uncovertebral degenerative changes C3-4 resulting in bilateral mild foraminal narrowing. There is bilateral facet arthropathy and uncovertebral degenerative change at C4-5 resulting in mild foraminal narrowing. There is bilateral uncovertebral degenerative change at C5-6 with mild foraminal narrowing. 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 The visualized portions of the lung apices demonstrate no focal abnormality. IMPRESSION: 1. No acute intracranial pathology. 2. No acute osseous injury of the cervical spine. Electronically Signed   By: Elige Ko   On: 09/14/2015 14:27   Ct Cervical Spine Wo Contrast  Result Date: 10/08/2015 CLINICAL DATA:  Fall with head injury.  History of dementia. EXAM: CT HEAD WITHOUT CONTRAST CT MAXILLOFACIAL WITHOUT CONTRAST CT CERVICAL SPINE WITHOUT CONTRAST TECHNIQUE: Multidetector CT imaging of the head, cervical spine, and maxillofacial structures were performed using the standard protocol without intravenous contrast. Multiplanar CT image reconstructions of the cervical spine and maxillofacial structures were also generated. COMPARISON:  CT of the head and cervical spine on 09/14/2015. FINDINGS: CT HEAD FINDINGS Stable advanced small vessel ischemic changes in the periventricular white matter and associated cortical atrophy. The brain demonstrates no evidence of hemorrhage, infarction, edema, mass effect, extra-axial fluid collection, hydrocephalus or mass lesion. The skull is unremarkable. New since the prior study is mucosal thickening within left-sided sinuses including frontal, ethmoid, maxillary  and sphenoid sinuses. CT MAXILLOFACIAL FINDINGS No acute maxillofacial fracture is identified. As seen on the head CT, mucosal sinus disease present with mucosal thickening within left-sided frontal, ethmoid, sphenoid and maxillary air cells. There is a retention cyst in the lower right maxillary antrum. Orbits have a normal appearance bilaterally. The temporomandibular joint show normal alignment. The nasal septum is mildly deviated to the left. CT CERVICAL SPINE FINDINGS The cervical spine shows normal alignment. There is no evidence of acute fracture or subluxation. No soft tissue swelling or hematoma is identified. Spondylosis throughout most of the cervical spine appears stable since the prior study. No bony or soft tissue lesions are seen. The visualized airway is normally patent. IMPRESSION: 1. No acute findings by head CT with stable advanced small vessel disease and atrophy. 2. No acute maxillofacial fracture identified. New mucosal sinus disease since the prior head CT affecting left-sided sinuses. 3. No evidence of acute cervical spine injury. Electronically Signed   By: Irish Lack M.D.   On: 10/08/2015 13:12   Ct Cervical Spine Wo Contrast  Result Date: 09/14/2015 CLINICAL DATA:  Status post fall.  Multiple recent falls. EXAM: CT HEAD WITHOUT CONTRAST CT CERVICAL SPINE WITHOUT CONTRAST TECHNIQUE: Multidetector CT imaging of the head and cervical spine was performed following the standard protocol without intravenous contrast. Multiplanar CT image  reconstructions of the cervical spine were also generated. COMPARISON:  MR brain 08/19/2013 FINDINGS: CT HEAD FINDINGS There is no evidence of mass effect, midline shift, or extra-axial fluid collections. There is no evidence of a space-occupying lesion or intracranial hemorrhage. There is no evidence of a cortical-based area of acute infarction. There is generalized cerebral atrophy. There is an old small infarct in the right cerebellum. There is an  old lacunar infarct of the right thalamus. There is periventricular white matter low attenuation likely secondary to microangiopathy. The ventricles and sulci are appropriate for the patient's age. The basal cisterns are patent. Visualized portions of the orbits are unremarkable. The visualized portions of the paranasal sinuses and mastoid air cells are unremarkable. Cerebrovascular atherosclerotic calcifications are noted. The osseous structures are unremarkable. CT CERVICAL SPINE FINDINGS The alignment is anatomic. The vertebral body heights are maintained. There is no acute fracture. There is no static listhesis. The prevertebral soft tissues are normal. The intraspinal soft tissues are not fully imaged on this examination due to poor soft tissue contrast, but there is no gross soft tissue abnormality. There is degenerative disc disease with disc height loss at C4-5, C5-6 and C6-7. There is bilateral facet arthropathy and uncovertebral degenerative changes C3-4 resulting in bilateral mild foraminal narrowing. There is bilateral facet arthropathy and uncovertebral degenerative change at C4-5 resulting in mild foraminal narrowing. There is bilateral uncovertebral degenerative change at C5-6 with mild foraminal narrowing. 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 The visualized portions of the lung apices demonstrate no focal abnormality. IMPRESSION: 1. No acute intracranial pathology. 2. No acute osseous injury of the cervical spine. Electronically Signed   By: Elige Ko   On: 09/14/2015 14:27   Mr Brain W Wo Contrast  Result Date: 10/06/2015 CLINICAL DATA:  Memory loss. History of stroke. Dementia with behavioral disturbance. Frequent falls. EXAM: MRI HEAD WITHOUT AND WITH CONTRAST TECHNIQUE: Multiplanar, multiecho pulse sequences of the brain and surrounding structures were obtained without and with intravenous contrast. CONTRAST:  83mL MULTIHANCE GADOBENATE DIMEGLUMINE 529 MG/ML IV SOLN  COMPARISON:  CT head 09/14/2015.  MRI 08/19/2013 FINDINGS: Progression of atrophy since 2015. Progressive ventricular enlargement and progressive enlargement of subarachnoid space since the prior study. In addition, there is significant progression of diffuse white matter signal abnormalities since 2015 which is now advanced. Chronic infarcts also noted in the thalamus bilaterally unchanged. Chronic infarct in the right pons has developed since the prior study. Chronic infarcts in the right cerebellum unchanged. Diffusion-weighted imaging demonstrates several areas of restricted diffusion. Linear area of restricted diffusion right frontal white matter. Restricted diffusion and enhancement in the anterior limb internal capsule on the right. Small area of restricted diffusion right temporoparietal periventricular white matter. Smaller restricted diffusion in the left anterior frontal lobe near the hypothalamus. These areas are felt to be due to acute and subacute infarction. Negative for mass or edema. Scattered areas of micro hemorrhage in the cerebral hemispheres bilaterally. No fluid collection. Postcontrast imaging demonstrates mild enhancement in the right anterior basal ganglia felt to be due to subacute infarction. No other areas of abnormal enhancement. Negative for hand sing mass lesion. Dural venous sinus enhancement normal. Mucosal edema throughout the paranasal sinuses. Normal orbital contents. IMPRESSION: Since 2015, there has been significant progression of atrophy and chronic microvascular ischemia. Multiple areas of acute and subacute infarction are present as described above. Multiple areas of chronic micro hemorrhage in the brain which may be due to hypertension or cerebral amyloid. This shows  mild progression since the prior MRI. Electronically Signed   By: Marlan Palau M.D.   On: 10/06/2015 14:24   Dg Humerus Left  Result Date: 10/08/2015 CLINICAL DATA:  Left upper arm pain and laceration  secondary to a fall today. EXAM: LEFT HUMERUS - 2+ VIEW COMPARISON:  None. FINDINGS: There is no evidence of fracture or other focal bone lesions. Soft tissues are unremarkable. IMPRESSION: Negative. Electronically Signed   By: Francene Boyers M.D.   On: 10/08/2015 13:28   Ct Maxillofacial Wo Contrast  Result Date: 10/08/2015 CLINICAL DATA:  Fall with head injury.  History of dementia. EXAM: CT HEAD WITHOUT CONTRAST CT MAXILLOFACIAL WITHOUT CONTRAST CT CERVICAL SPINE WITHOUT CONTRAST TECHNIQUE: Multidetector CT imaging of the head, cervical spine, and maxillofacial structures were performed using the standard protocol without intravenous contrast. Multiplanar CT image reconstructions of the cervical spine and maxillofacial structures were also generated. COMPARISON:  CT of the head and cervical spine on 09/14/2015. FINDINGS: CT HEAD FINDINGS Stable advanced small vessel ischemic changes in the periventricular white matter and associated cortical atrophy. The brain demonstrates no evidence of hemorrhage, infarction, edema, mass effect, extra-axial fluid collection, hydrocephalus or mass lesion. The skull is unremarkable. New since the prior study is mucosal thickening within left-sided sinuses including frontal, ethmoid, maxillary and sphenoid sinuses. CT MAXILLOFACIAL FINDINGS No acute maxillofacial fracture is identified. As seen on the head CT, mucosal sinus disease present with mucosal thickening within left-sided frontal, ethmoid, sphenoid and maxillary air cells. There is a retention cyst in the lower right maxillary antrum. Orbits have a normal appearance bilaterally. The temporomandibular joint show normal alignment. The nasal septum is mildly deviated to the left. CT CERVICAL SPINE FINDINGS The cervical spine shows normal alignment. There is no evidence of acute fracture or subluxation. No soft tissue swelling or hematoma is identified. Spondylosis throughout most of the cervical spine appears stable since  the prior study. No bony or soft tissue lesions are seen. The visualized airway is normally patent. IMPRESSION: 1. No acute findings by head CT with stable advanced small vessel disease and atrophy. 2. No acute maxillofacial fracture identified. New mucosal sinus disease since the prior head CT affecting left-sided sinuses. 3. No evidence of acute cervical spine injury. Electronically Signed   By: Irish Lack M.D.   On: 10/08/2015 13:12    Microbiology: No results found for this or any previous visit (from the past 240 hour(s)).   Labs: Basic Metabolic Panel:  Recent Labs Lab 10/08/15 1330 10/09/15 0918 10/10/15 0555 10/11/15 0702  NA 136  --  136 135  K 3.1*  --  3.1* 3.9  CL 100*  --  100* 104  CO2 28  --  24 23  GLUCOSE 91  --  88 87  BUN 7  --  8 6  CREATININE 0.81  --  0.67 0.67  CALCIUM 9.0  --  8.9 8.7*  MG  --  1.9  --   --    Liver Function Tests:  Recent Labs Lab 10/08/15 1330  AST 31  ALT 13*  ALKPHOS 81  BILITOT 1.2  PROT 6.8  ALBUMIN 3.3*   No results for input(s): LIPASE, AMYLASE in the last 168 hours. No results for input(s): AMMONIA in the last 168 hours. CBC:  Recent Labs Lab 10/08/15 1330  WBC 7.5  NEUTROABS 5.6  HGB 14.9  HCT 44.3  MCV 87.0  PLT 129*   Cardiac Enzymes:  Recent Labs Lab 10/08/15 1330 10/08/15 1852  10/09/15 0532  CKTOTAL 718* 663* 367  TROPONINI <0.03  --   --    BNP: BNP (last 3 results) No results for input(s): BNP in the last 8760 hours.  ProBNP (last 3 results) No results for input(s): PROBNP in the last 8760 hours.  CBG: No results for input(s): GLUCAP in the last 168 hours.     SignedRamiro Harvest MD.  [facility] 10/11/2015, 12:57 PM

## 2015-10-11 NOTE — Progress Notes (Signed)
Patient is being discharged to [facility] in [place]. Report was called and given to [facility]. Discharge package was given to transporter and patient wife was called and made aware at [phone].

## 2015-10-11 NOTE — Clinical Social Work Placement (Signed)
   CLINICAL SOCIAL WORK PLACEMENT  NOTE  Date:  10/11/2015  Patient Details  Name: Terry Francis MRN: [id number] Date of Birth: [birth year]  Clinical Social Work is seeking post-discharge placement for this patient at the Skilled  Nursing Facility level of care (*CSW will initial, date and re-position this form in  chart as items are completed):  Yes   Patient/family provided with [place] Clinical Social Work Department's list of facilities offering this level of care within the geographic area requested by the patient (or if unable, by the patient's family).  Yes   Patient/family informed of their freedom to choose among providers that offer the needed level of care, that participate in Medicare, Medicaid or managed care program needed by the patient, have an available bed and are willing to accept the patient.  Yes   Patient/family informed of [facility]'s ownership interest in [facility] and [facility], as well as of the fact that they are under no obligation to receive care at these facilities.  PASRR submitted to EDS on 10/10/15     PASRR number received on 10/10/15     Existing PASRR number confirmed on       FL2 transmitted to all facilities in geographic area requested by pt/family on 10/10/15     FL2 transmitted to all facilities within larger geographic area on 10/10/15     Patient informed that his/her managed care company has contracts with or will negotiate with certain facilities, including the following:        Yes   Patient/family informed of bed offers received.  Patient chooses bed at [facility]     Physician recommends and patient chooses bed at      Patient to be transferred to [facility] on 10/11/15  Patient to be transferred to facility by [name]    Patient family notified on 8/12/16f transfer.  Name of family member notified:  Wife:  [name]   PHYSICIAN       Additional Comment: RN to call  report to facility. DC summary sent to facility for review. EMS arranged. CSW spoke with Chantelle- Admissions at [facility]. She will contact patient's wife re: completion of admission papers. Wife is aware of d/c and denies any questions at this time.  Patient noted to be alert but not oriented at this time.  No further CSW needs identified.  CSW signing off.      _______________________________________________ Darylene Price, LCSW 10/11/2015, 2:05 PM

## 2016-03-01 DEATH — deceased

## 2017-10-15 IMAGING — MR MR HEAD WO/W CM
12 series · 48 of 48 positions shown · IV contrast (15 ml multihance)
Comparison: CT head 09/14/2015.  MRI 08/19/2013

CLINICAL DATA: Memory loss. History of stroke. Dementia with
behavioral disturbance. Frequent falls.

EXAM:
MRI HEAD WITHOUT AND WITH CONTRAST
TECHNIQUE: Multiplanar, multiecho pulse sequences of the brain and surrounding
structures were obtained without and with intravenous contrast.
CONTRAST:  15mL MULTIHANCE GADOBENATE DIMEGLUMINE 529 MG/ML IV SOLN

[Series 5: T1 · sagittal · 4.0mm · 0.75mm/px · 2 of 31 slices shown (1 of 3)]
[im 1/31]
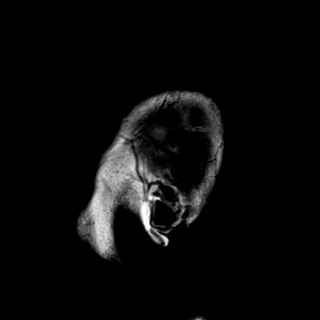
[im 31/31]
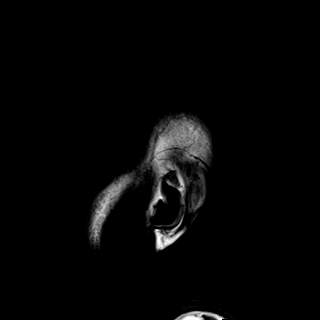

[Series 6: DWI · axial · 3.0mm · 1.44mm/px · z∈[-60,+78]mm · 5 of 86 slices shown (1 of 4)]
[im 1/86]
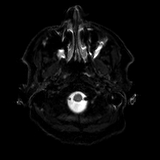
[im 22/86]
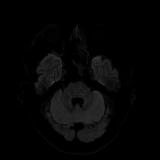
[im 43/86]
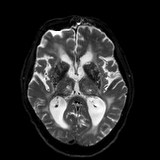
[im 64/86]
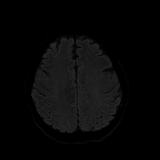
[im 86/86]
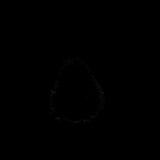

[Series 7: DWI · axial · 3.0mm · 1.44mm/px · z∈[-60,+78]mm · 3 of 43 slices shown (2 of 4)]
[im 1/43]
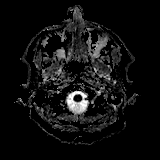
[im 22/43]
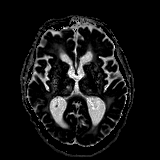
[im 43/43]
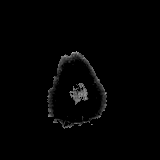

[Series 9: DWI · coronal · 5.0mm · 1.44mm/px · 4 of 60 slices shown (3 of 4)]
[im 1/60]
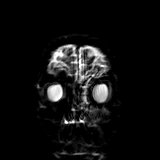
[im 20/60]
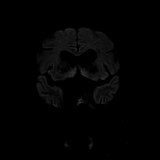
[im 40/60]
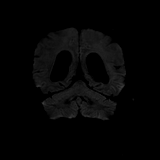
[im 60/60]
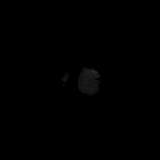

[Series 10: DWI · coronal · 5.0mm · 1.44mm/px · 2 of 30 slices shown (4 of 4)]
[im 1/30]
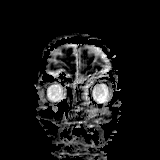
[im 30/30]
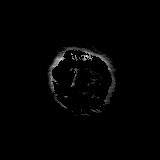

[Series 11: T2 · axial · 4.0mm · 0.36mm/px · z∈[-64,+80]mm · 2 of 29 slices shown]
[im 1/29]
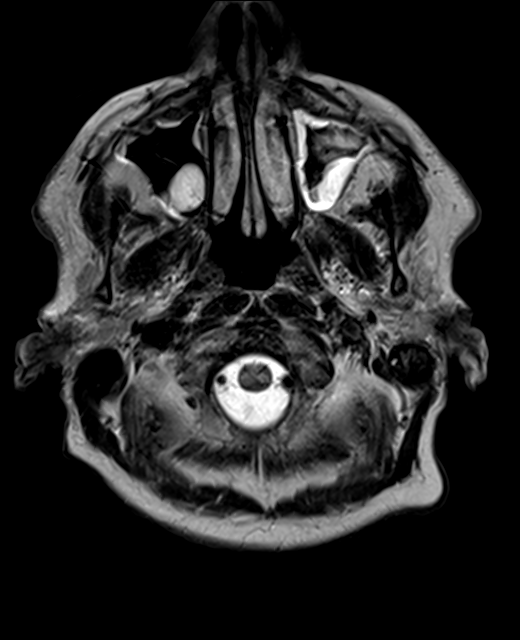
[im 29/29]
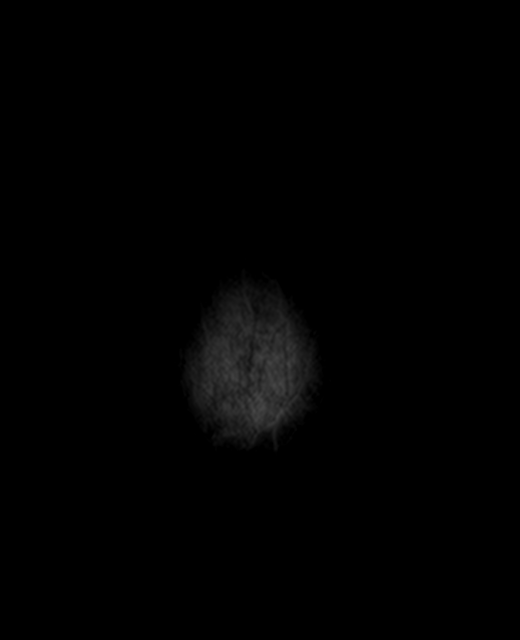

[Series 14: swi_images · axial · 1.5mm · 0.90mm/px · z∈[-63,+78]mm · 6 of 95 slices shown]
[im 1/95]
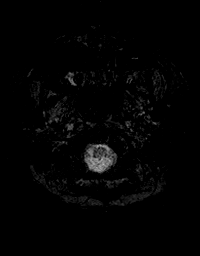
[im 19/95]
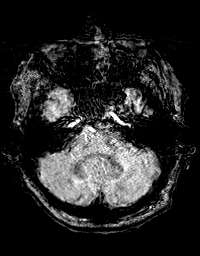
[im 38/95]
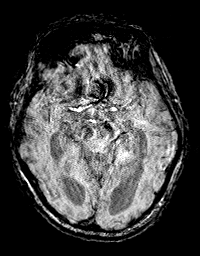
[im 57/95]
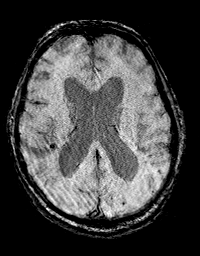
[im 76/95]
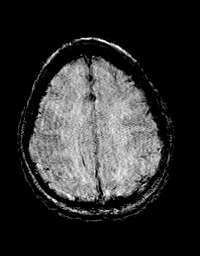
[im 95/95]
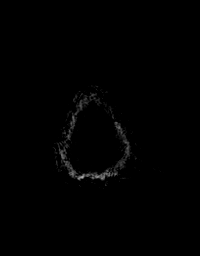

[Series 15: FLAIR · axial · 4.0mm · 0.72mm/px · z∈[-66,+79]mm · 2 of 29 slices shown]
[im 1/29]
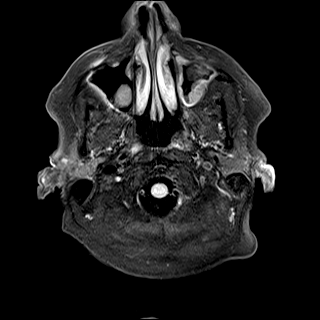
[im 29/29]
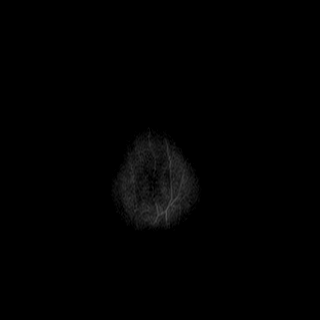

[Series 16: T1 · axial · 1.0mm · 0.90mm/px · z∈[-63,+80]mm · 9 of 144 slices shown (2 of 3)]
[im 1/144]
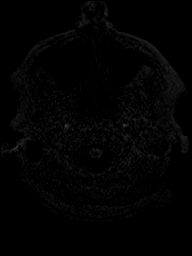
[im 18/144]
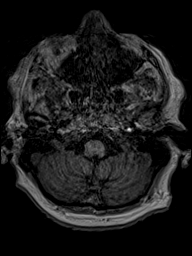
[im 36/144]
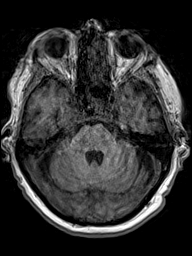
[im 54/144]
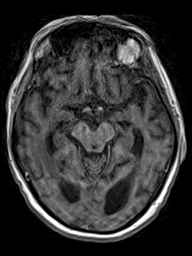
[im 72/144]
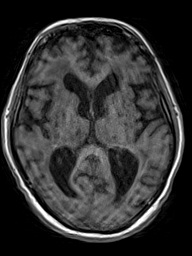
[im 90/144]
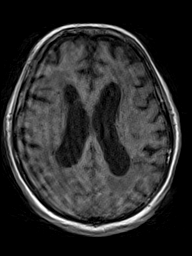
[im 108/144]
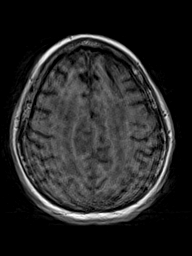
[im 126/144]
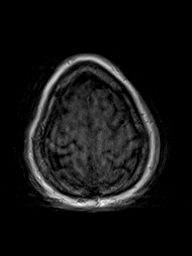
[im 144/144]
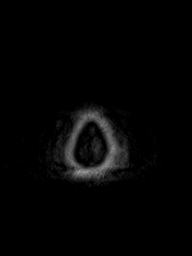

[Series 17: T2 post-contrast · coronal · 4.5mm · 0.36mm/px · 2 of 30 slices shown]
[im 1/30]
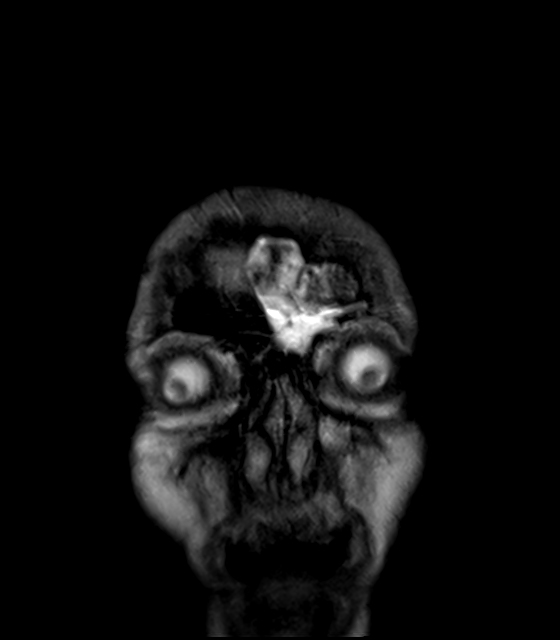
[im 30/30]
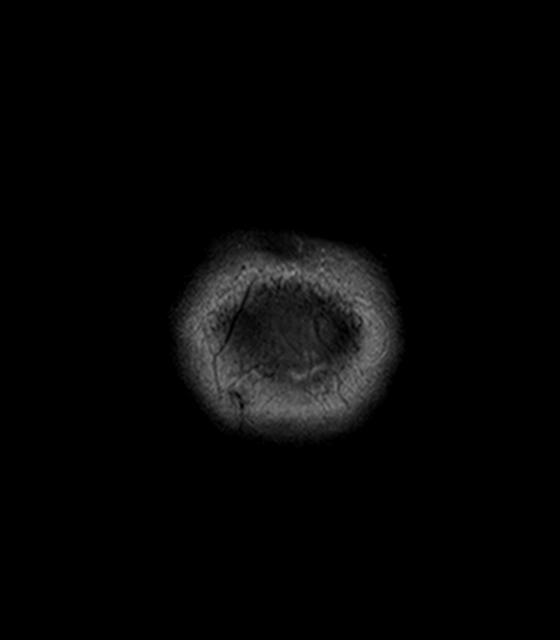

[Series 18: T1 · axial · 1.0mm · 0.90mm/px · z∈[-63,+80]mm · 9 of 144 slices shown (3 of 3)]
[im 1/144]
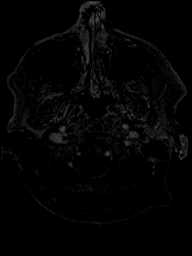
[im 18/144]
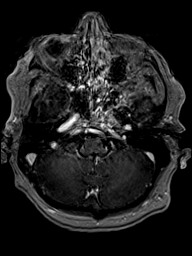
[im 36/144]
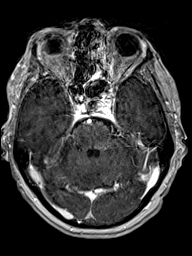
[im 54/144]
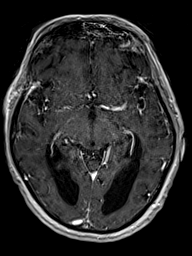
[im 72/144]
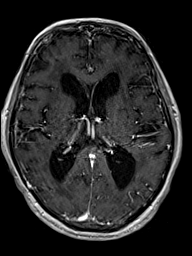
[im 90/144]
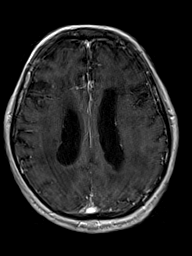
[im 108/144]
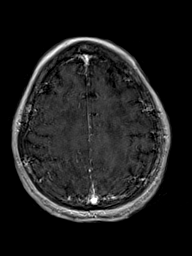
[im 126/144]
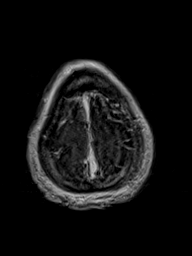
[im 144/144]
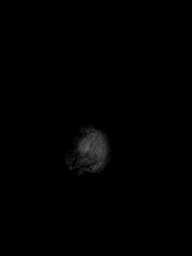

[Series 19: T1 post-contrast · coronal · 4.5mm · 0.72mm/px · 2 of 30 slices shown]
[im 1/30]
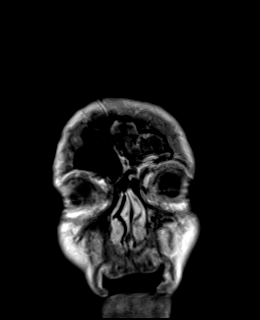
[im 30/30]
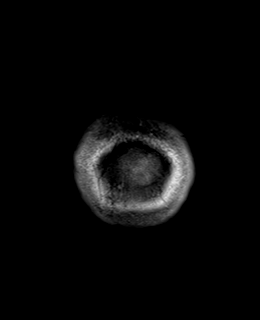

[48 of 48 positions shown; findings below may reference images not displayed]

FINDINGS: Progression of atrophy since 7633. Progressive ventricular
enlargement and progressive enlargement of subarachnoid space since
the prior study. In addition, there is significant progression of
diffuse white matter signal abnormalities since 7633 which is now
advanced. Chronic infarcts also noted in the thalamus bilaterally
unchanged. Chronic infarct in the right pons has developed since the
prior study. Chronic infarcts in the right cerebellum unchanged.

Diffusion-weighted imaging demonstrates several areas of restricted
diffusion. Linear area of restricted diffusion right frontal white
matter. Restricted diffusion and enhancement in the anterior limb
internal capsule on the right. Small area of restricted diffusion
right temporoparietal periventricular white matter. Smaller
restricted diffusion in the left anterior frontal lobe near the
hypothalamus. These areas are felt to be due to acute and subacute
infarction.

Negative for mass or edema.

Scattered areas of micro hemorrhage in the cerebral hemispheres
bilaterally. No fluid collection.

Postcontrast imaging demonstrates mild enhancement in the right
anterior basal ganglia felt to be due to subacute infarction. No
other areas of abnormal enhancement. Negative for hand sing mass
lesion. Dural venous sinus enhancement normal.

Mucosal edema throughout the paranasal sinuses. Normal orbital
contents.
IMPRESSION: Since 7633, there has been significant progression of atrophy and
chronic microvascular ischemia.

Multiple areas of acute and subacute infarction are present as
described above.

Multiple areas of chronic micro hemorrhage in the brain which may be
due to hypertension or cerebral amyloid. This shows mild progression
since the prior MRI.
